# Patient Record
Sex: Male | Born: 1965 | Race: White | Hispanic: No | Marital: Married | Smoking: Heavy tobacco smoker
Health system: Southern US, Community
[De-identification: ages and names within clinical notes are randomized; demographics above are authoritative.]

## PROBLEM LIST (undated history)

## (undated) DIAGNOSIS — Z9889 Other specified postprocedural states: Secondary | ICD-10-CM

## (undated) DIAGNOSIS — I10 Essential (primary) hypertension: Secondary | ICD-10-CM

## (undated) DIAGNOSIS — B1920 Unspecified viral hepatitis C without hepatic coma: Secondary | ICD-10-CM

## (undated) DIAGNOSIS — F1910 Other psychoactive substance abuse, uncomplicated: Secondary | ICD-10-CM

## (undated) DIAGNOSIS — G4730 Sleep apnea, unspecified: Secondary | ICD-10-CM

## (undated) DIAGNOSIS — E782 Mixed hyperlipidemia: Secondary | ICD-10-CM

## (undated) DIAGNOSIS — F419 Anxiety disorder, unspecified: Secondary | ICD-10-CM

## (undated) DIAGNOSIS — R519 Headache, unspecified: Secondary | ICD-10-CM

## (undated) DIAGNOSIS — F32A Depression, unspecified: Secondary | ICD-10-CM

## (undated) DIAGNOSIS — F1990 Other psychoactive substance use, unspecified, uncomplicated: Secondary | ICD-10-CM

## (undated) DIAGNOSIS — F1911 Other psychoactive substance abuse, in remission: Secondary | ICD-10-CM

## (undated) HISTORY — DX: Other psychoactive substance abuse, in remission: F19.11

## (undated) HISTORY — PX: APPENDECTOMY: SHX54

## (undated) HISTORY — PX: BACK SURGERY: SHX140

## (undated) HISTORY — DX: Essential (primary) hypertension: I10

## (undated) HISTORY — DX: Other specified postprocedural states: Z98.890

## (undated) HISTORY — DX: Mixed hyperlipidemia: E78.2

---

## 1997-11-09 ENCOUNTER — Encounter: Admission: RE | Admit: 1997-11-09 | Discharge: 1998-02-07 | Payer: Self-pay | Admitting: Neurosurgery

## 1998-01-24 ENCOUNTER — Encounter: Admission: RE | Admit: 1998-01-24 | Discharge: 1998-04-24 | Payer: Self-pay | Admitting: Anesthesiology

## 1998-05-11 ENCOUNTER — Encounter: Payer: Self-pay | Admitting: Neurosurgery

## 1998-05-11 ENCOUNTER — Ambulatory Visit (HOSPITAL_COMMUNITY): Admission: AD | Admit: 1998-05-11 | Discharge: 1998-05-11 | Payer: Self-pay | Admitting: Neurosurgery

## 1998-05-24 ENCOUNTER — Encounter: Payer: Self-pay | Admitting: Neurosurgery

## 1998-05-24 ENCOUNTER — Inpatient Hospital Stay (HOSPITAL_COMMUNITY): Admission: RE | Admit: 1998-05-24 | Discharge: 1998-05-26 | Payer: Self-pay | Admitting: Neurosurgery

## 1998-06-25 ENCOUNTER — Ambulatory Visit (HOSPITAL_COMMUNITY): Admission: RE | Admit: 1998-06-25 | Discharge: 1998-06-25 | Payer: Self-pay | Admitting: Neurosurgery

## 1998-06-25 ENCOUNTER — Encounter: Payer: Self-pay | Admitting: Neurosurgery

## 1998-08-16 ENCOUNTER — Encounter: Payer: Self-pay | Admitting: Neurosurgery

## 1998-08-16 ENCOUNTER — Ambulatory Visit (HOSPITAL_COMMUNITY): Admission: RE | Admit: 1998-08-16 | Discharge: 1998-08-16 | Payer: Self-pay | Admitting: Neurosurgery

## 1999-02-28 ENCOUNTER — Encounter: Admission: RE | Admit: 1999-02-28 | Discharge: 1999-05-23 | Payer: Self-pay | Admitting: Anesthesiology

## 1999-05-23 ENCOUNTER — Encounter: Admission: RE | Admit: 1999-05-23 | Discharge: 1999-08-21 | Payer: Self-pay | Admitting: Anesthesiology

## 1999-08-21 ENCOUNTER — Encounter: Admission: RE | Admit: 1999-08-21 | Discharge: 1999-11-13 | Payer: Self-pay | Admitting: Anesthesiology

## 1999-11-13 ENCOUNTER — Encounter: Admission: RE | Admit: 1999-11-13 | Discharge: 2000-02-11 | Payer: Self-pay | Admitting: Anesthesiology

## 2000-02-20 ENCOUNTER — Encounter: Admission: RE | Admit: 2000-02-20 | Discharge: 2000-05-20 | Payer: Self-pay | Admitting: Anesthesiology

## 2000-06-18 ENCOUNTER — Encounter: Admission: RE | Admit: 2000-06-18 | Discharge: 2000-09-16 | Payer: Self-pay | Admitting: Anesthesiology

## 2000-09-09 ENCOUNTER — Encounter: Payer: Self-pay | Admitting: Anesthesiology

## 2000-09-09 ENCOUNTER — Ambulatory Visit (HOSPITAL_COMMUNITY): Admission: RE | Admit: 2000-09-09 | Discharge: 2000-09-09 | Payer: Self-pay | Admitting: Anesthesiology

## 2000-09-17 ENCOUNTER — Encounter: Admission: RE | Admit: 2000-09-17 | Discharge: 2000-12-16 | Payer: Self-pay | Admitting: Anesthesiology

## 2001-01-14 ENCOUNTER — Encounter: Admission: RE | Admit: 2001-01-14 | Discharge: 2001-04-10 | Payer: Self-pay | Admitting: Anesthesiology

## 2002-02-25 ENCOUNTER — Emergency Department (HOSPITAL_COMMUNITY): Admission: EM | Admit: 2002-02-25 | Discharge: 2002-02-25 | Payer: Self-pay | Admitting: Internal Medicine

## 2002-02-28 ENCOUNTER — Ambulatory Visit (HOSPITAL_COMMUNITY): Admission: RE | Admit: 2002-02-28 | Discharge: 2002-02-28 | Payer: Self-pay | Admitting: Internal Medicine

## 2006-12-19 ENCOUNTER — Ambulatory Visit (HOSPITAL_COMMUNITY): Admission: RE | Admit: 2006-12-19 | Discharge: 2006-12-19 | Payer: Self-pay | Admitting: Neurosurgery

## 2007-12-20 ENCOUNTER — Ambulatory Visit: Payer: Self-pay | Admitting: Psychiatry

## 2007-12-20 ENCOUNTER — Inpatient Hospital Stay (HOSPITAL_COMMUNITY): Admission: AD | Admit: 2007-12-20 | Discharge: 2007-12-22 | Payer: Self-pay | Admitting: Psychiatry

## 2009-03-15 ENCOUNTER — Emergency Department (HOSPITAL_COMMUNITY): Admission: EM | Admit: 2009-03-15 | Discharge: 2009-03-15 | Payer: Self-pay | Admitting: Emergency Medicine

## 2009-04-23 ENCOUNTER — Ambulatory Visit: Payer: Self-pay | Admitting: Pain Medicine

## 2009-05-21 ENCOUNTER — Ambulatory Visit: Payer: Self-pay | Admitting: Pain Medicine

## 2009-05-29 ENCOUNTER — Ambulatory Visit: Payer: Self-pay | Admitting: Pain Medicine

## 2009-06-18 ENCOUNTER — Ambulatory Visit: Payer: Self-pay | Admitting: Pain Medicine

## 2009-07-16 ENCOUNTER — Ambulatory Visit: Payer: Self-pay | Admitting: Pain Medicine

## 2009-08-15 ENCOUNTER — Ambulatory Visit: Payer: Self-pay | Admitting: Pain Medicine

## 2009-09-12 ENCOUNTER — Ambulatory Visit: Payer: Self-pay | Admitting: Pain Medicine

## 2009-10-03 ENCOUNTER — Ambulatory Visit: Payer: Self-pay | Admitting: Pain Medicine

## 2009-12-04 ENCOUNTER — Ambulatory Visit: Admission: RE | Admit: 2009-12-04 | Discharge: 2009-12-07 | Payer: Self-pay | Admitting: Anesthesiology

## 2009-12-12 ENCOUNTER — Encounter (HOSPITAL_COMMUNITY): Admission: RE | Admit: 2009-12-12 | Discharge: 2010-01-11 | Payer: Self-pay | Admitting: Anesthesiology

## 2010-12-24 NOTE — H&P (Signed)
Jerome Camacho, Jerome Camacho             ACCOUNT NO.:  [id number]   MEDICAL RECORD NO.:  [id number]          PATIENT TYPE:  IPS   LOCATION:  0500                          FACILITY:  [facility]   PHYSICIAN:  Geoffery Lyons, M.D.      DATE OF BIRTH:  1966/03/02   DATE OF ADMISSION:  12/20/2007  DATE OF DISCHARGE:                       PSYCHIATRIC ADMISSION ASSESSMENT   IDENTIFYING INFORMATION:  A 45 year old white male, married.  This is a  voluntary admission.   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  He  presented to our hospital as a walk-in patient complaining of some  suicidal thoughts.  Said that he had not gotten as far as developing any  kind of plan.  Says that he is about to lose his business and his home  and his mother's house that he is also responsible for.  He cites  problems with the economic down-turn.  His business is not what it  should be.  Also financial stressors because his chronic back pain  prevents him from seeking other employment.  Struggling with back pain  with poor relief.  He is followed by Dr. Thyra Breed.  He had been  putting off getting help for his depression.  Feels that his pain is a  major factor in his depression.  Therefore he says he flushed his last  week of pain medicines down the toilet in order to motivate himself to  come for treatment.  Denies any homicidal thought.  No active suicidal  thoughts today although he endorses depressed mood with anhedonia,  decreased energy, sadness, a lot of worry about his financial situation  disrupting his sleep.  No panic attacks.  He denies substance abuse or  homicidal thoughts.   PAST PSYCHIATRIC HISTORY:  First [facility] admission.  He  does have a history of being treated in the past by Milagros Evener, M.D.,  and has been on Lexapro, Serzone and other SSRIs.  He has felt that  nothing has helped him.   He took Lexapro in the recent past but not in  the last few weeks because he was unable to afford it. No known history  of prior suicide attempts.  Reports that Prozac had given him weird  thoughts.  Endorses a history of polysubstance abuse in his late teens  including everything I could get my hands on, but denies substance  abuse since age 58, has endorsed some recent abuse of his opiates and is  on a current pain contract with Dr. Thyra Breed, his pain management  physician.   SOCIAL HISTORY:  Married white male, owns his own business, has a  daughter and a granddaughter that he is close to.  No current legal  problems.  Denies alcohol use.   FAMILY HISTORY:  He denies a history of mental illness or substance  abuse.   MEDICAL HISTORY:  Followed by Dr. Thyra Breed at [facility].  Current medical problems are chronic back pain.  He has  also been seen by Dr.  Roy for surgery, but does not have insurance at  this time to cover it.   CURRENT MEDICATIONS:  1. Oxycodone 30 mg one every 4 hours as needed for breakthrough pain,      last dispensed #90 on Dec 10, 2007.  2. Valium 5 mg 1 tablet by mouth every 6 hours, last dispensed 120      tablets on November 20, 2007.   DRUG ALLERGIES:  None.  He says that Prozac gave him weird thoughts in  the past.   PHYSICAL EXAMINATION:  Physical exam is noted in the record.  Normal  motor exam.  This is a 5 feet 10 inch tall gentleman, 279 pounds,  temperature 98, pulse 69, respirations 20, blood pressure 119/77.   PAST MEDICAL HISTORY:  Remarkable for  1. Appendectomy at age 42.  2. One episode of back surgery in 1999.   DIAGNOSTIC STUDIES:  Urine drug screen is currently pending.  CBC and  chemistry within normal limits.  Liver enzymes normal.  Routine  urinalysis within normal limits.   MENTAL STATUS EXAM:  Fully alert gentleman in no acute distress, blunted  affect, complaining of back pain, having backache from  sleeping in our  beds last night.  Fully alert, coherent, in full contact with reality.  Speech is normal.  Mood is depressed.  Clearly concerned about his  financial stressors, stands to lose a couple of homes to foreclosure,  business is poor and he is unable to keep up with their financial needs  at home.  Having some depressed thoughts, thinking about suicide but no  specific plan.  Cognition is completely intact.  Insight is adequate.  Impulse control and judgment within normal limits.  Concentration and  calculation intact.  Thinking is goal oriented and linear, no evidence  of psychosis.  No delusional statements made.  No flight of ideas or  paranoia.   AXIS I:  Depressive disorder, not otherwise specified.  AXIS II:  Deferred.  AXIS III:  Chronic back pain.  AXIS IV:  Severe financial and employment issues.  AXIS V:  Current 50, past year 69, estimated.   PLAN:  Voluntarily admit the patient with a goal of alleviating his  suicidal thought.  Will go with Celexa, thinking that this might be a  cheaper alternative for him since it is available at $4 for a month's  supply.  He is agreeable to have a family session with his wife.  He is  scheduled for follow-up with his pain physician on Monday and he is  currently enrolled in our depression group.   ESTIMATED LENGTH OF STAY:  2-3 days.      Margaret A. Scott, N.P.      Geoffery Lyons, M.D.  Electronically Signed    MAS/MEDQ  D:  12/21/2007  T:  12/21/2007  Job:  [id number]

## 2010-12-24 NOTE — H&P (Signed)
Jerome Camacho, Jerome Camacho             ACCOUNT NO.:  [id number]   MEDICAL RECORD NO.:  [id number]          PATIENT TYPE:  IPS   LOCATION:  0500                          FACILITY:  [facility]   PHYSICIAN:  Geoffery Lyons, M.D.      DATE OF BIRTH:  [birth year]   DATE OF ADMISSION:  12/20/2007  DATE OF DISCHARGE:                       PSYCHIATRIC ADMISSION ASSESSMENT   TIME OF ASSESSMENT:  9:25 a.m.   IDENTIFYING INFORMATION:  A 45 year old white male.  This is a voluntary  admission.  He is married.   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  He presented as a walk-in to  our hospital complaining of some depression which had gotten worse over  the course of the past 3-4 weeks.  He cites a lot of financial stressors  with economic downturn in his business.  He also reports he has a  history of chronic back pain and this prevents him from working full-  time or pursuing other employment.  The combination of these 2 factors  have created quite a bit of stress in his marriage and made him quite  depressed.  He was previously taking Lexapro, but could not afford it.  Denies that he has gone as far as making any plan for suicide, but  endorses depressed mood.  No homicidal thoughts.   PAST PSYCHIATRIC HISTORY:  First [facility] admission.  He endorses a history  of depression in the past and at one point saw Dr. Milagros Evener and was  treated with Serzone.  He has also had additional SSRIs in the past  which he felt did not help him and had some headache.  Difficulty  affording medication and unable to afford his current Lexapro.  No  history of prior suicide attempts.  He does endorse a past history of  opiate abuse.  Endorses that in his use, he abused anything I could get  my hands on, but has not abused drugs since age 73.   SOCIAL HISTORY:  Married white male.  Has a daughter and granddaughter  that he is close to.  Endorses some  marital stressors due to financial  circumstances as noted.  Owns his own business.  No legal problems.   MEDICAL HISTORY:  The patient is followed by Dr. Thyra Breed at the  [facility].   CURRENT MEDICATIONS:  1. Diazepam 5 mg 1 q.6 h., number 120 tablets, last filled on November 20, 2007.  2. Oxycodone/hydrochloride 30 mg tablets, dispensed number 90 on Dec 10, 2007, to take 1 tablet every 4 hours as needed for breakthrough      pain.   Dictation ends at this point.      Margaret A. Scott, N.P.      Geoffery Lyons, M.D.  Electronically Signed    MAS/MEDQ  D:  12/21/2007  T:  12/21/2007  Job:  [id number]

## 2010-12-27 NOTE — Consult Note (Signed)
[facility]  Patient:    Jerome Camacho, Jerome Camacho                 MRN: [id number] Proc. Date: 12/26/99 Attending:  Thyra Breed, M.D. CC:         Eliezer Bottom, M.D.             Kari Baars, M.D.             [name], M.D.                          Consultation Report  HISTORY:  The patient dropped in today after seeing Dr. Evelene Croon.  He has apparently run out of Tylox.  He injured his ankle over the weekend and has had to take increased doses of this.  He has not sought any medical attention for this.  He is also working through some issues of some events that occurred with his father and his other family members.  PHYSICAL EXAMINATION:  VITAL SIGNS:  Blood pressure 106/61.  Heart rate 75.  Respiratory rate 16.  O2 saturations 97%.  Pain level 8/10.  Temperature 97.1 degrees.  EXTREMITIES:  The patient demonstrates pain over the lateral malleolus, which is accentuated by inversion of his foot.  He has a contusion over the lateral aspect of the left ankle.  IMPRESSION: 1. Left ankle pain which I suspect is either a tear of ligaments/tendons or    potential underlying fracture of the lateral malleolus. 2. Low back pain on the basis of degenerative disk disease. 3. Myofascial pain. 4. Depression per Dr. Evelene Croon. 5. Other medical problems per Dr. Kari Baars.  DISPOSITION: 1. Discontinue Tylox.  Continue on Percocet, 1-2 p.o. q.6h. p.r.n. pain in the eft    ankle, #100 with no refill. 2. Continue on methadone as previously. 3. The patient will see one of the orthopedic surgeons at [facility] at    3:00 p.m. today.  He is in agreement with follow-up there.  We were trying to    avoid having him go through the emergency room. DD:  12/26/99 TD:  12/31/99 Job: [id number]

## 2010-12-27 NOTE — H&P (Signed)
[facility]  Patient:    Jerome Camacho, Jerome Camacho                 MRN: [id number] Adm. Date:  [id number] Attending:  Thyra Breed                         History and Physical  CHIEF COMPLAINT:  Jerome Camacho actually looks better today than he has looked in a long  time.  As I explained to him, I attribute a lot of this improvement to Dr. Eliezer Bottom and her treatment of his depression.  He is much less depressed than he has been in the past.  HISTORY OF PRESENT ILLNESS:  He did have a heat stroke when he went to an outdoor concert, and was treated in the an emergency room for this.  He rates his pain t 5/10.  He continues to have pain radiating out to the left lower extremity, and he has some left shoulder pain, which is mostly myofascial in origin.  He is losing weight.  He is asking about exercising and going back to work on light duty.  CURRENT MEDICATIONS: 1. Serzone 200 mg q.d. 2. Methadone 5 mg t.i.d. 3. Xanax very rarely.  PHYSICAL EXAMINATION:  VITAL SIGNS:  Blood pressure 125/62, heart rate 61, respirations 16, O2 saturation 95%.  Pain level 5/10.  Temperature 97.3 degrees.  NEUROLOGIC:  Straight leg raising signs are negative.  Deep tendon reflexes 0-1+ at the knees, and 2+ at the ankles and symmetric.  Motor is 5/5.  He has some tenderness in his left shoulder to palpation in the trapezius muscle.  ADDITIONAL HISTORY:  The patient notes that his left ankle is not bothering him  superiorly.  IMPRESSION: 1. Low back pain, status post surgical intervention for degenerative disk    disease. 2. Depression, per Dr. Evelene Croon.  DISPOSITION: 1. Continue on methadone 5 mg one p.o. q.8h. #90, with no refills. 2. Tylox 5/500 one p.o. q.8h. p.r.n. breakthrough pain #50, with no refills. 3. Remain off Zanaflex which he was intolerant of. 4. The patient was encouraged to follow up with Dr. Evelene Croon. 5. Follow up with me in four weeks. 6. The  patient was advised to go ahead and return to light duty at work,    but no lifting. 7. He was encouraged to go ahead and walk at least one mile per day. 8. He is asking about getting established with a personal trainer who has    worked with people with low back pain in the past, and I encouraged him    to continue with communication with this gentleman, but that I would like    to see him recondition himself with walking initially, before proceeding    to any more vigorous rehabilitation.  I do feel that he will ultimately    benefit from this. DD:  02/20/00 TD:  02/20/00 Job: [id number]

## 2010-12-27 NOTE — Procedures (Signed)
[facility]  Patient:    Jerome Camacho, Jerome Camacho                 MRN: [id number] Proc. Date: 09/11/00 Adm. Date:  [id number] Attending:  Thyra Breed                           Procedure Report  PROCEDURE:  Lumbar epidural steroid injection.  DIAGNOSIS:  Lumbar radiculopathy with lumbar degenerative disk disease at the L4-5 with a disk protrusion pressing on the left L5 nerve root.  ANESTHESIOLOGIST:  Thyra Breed, M.D.  INTERVAL HISTORY:  The patient has noted some lessening of his left leg discomfort, but he continues to have this episodically.  It is brought on by prolonged sitting or over activity.  He continues to have some left groin discomfort.  I reviewed his MRI from January 30 which does show an annular disk bulge at 4-5 with shallow protrusion over to the left L5 nerve root.  I advised him that we needed to consider epidural steroid injections since he has not responded to conservative management with muscle relaxants and oral corticosteroids.  We reviewed the potential risks and benefits of this.  MEDICATIONS:  Unchanged.  PHYSICAL EXAMINATION:  VITAL SIGNS:  The patient is afebrile with vital signs stable.  NEUROLOGIC:  Grossly unchanged from his last visit.  DESCRIPTION OF PROCEDURE:  After informed consent was obtained, the patient was placed in the sitting position and monitored.  His back was prepped with Betadine x 3.  A skin wheal was raised at the L4-5 inner space with 1% lidocaine.  A 20-gauge Tuohy needle was introduced in the lumbar epidural space to loss of resistance to preservative free normal saline.  There was no CSF nor blood.  Medrol 80 mg and 8 ml preservative free normal saline was gently injected.  The needle was flushed with preservative free normal saline and removed intact.  POSTPROCEDURE CONDITION:  Stable.  DISCHARGE INSTRUCTIONS: 1. Resume previous diet. 2. Limitation of activities per instruction  sheet. 3. Continue on current medications. 4. Follow up with me in one week for repeat injection. DD:  09/11/00 TD:  09/12/00 Job: [id number]

## 2010-12-27 NOTE — Discharge Summary (Signed)
Jerome Camacho, Jerome Camacho             ACCOUNT NO.:  [id number]   MEDICAL RECORD NO.:  [id number]          PATIENT TYPE:  IPS   LOCATION:  0500                          FACILITY:  [facility]   PHYSICIAN:  Geoffery Lyons, M.D.      DATE OF BIRTH:  09-04-1965   DATE OF ADMISSION:  12/20/2007  DATE OF DISCHARGE:  12/22/2007                               DISCHARGE SUMMARY   CHIEF COMPLAINT AND PRESENT ILLNESS:  This was the first admission to  [facility] for this 45 year old white male, married,  voluntarily admitted.  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  He  presented to the hospital as a walk-in, complaining of suicidal  thoughts, but not gotten as far as developing a kind of plan.  He was  about to lose his business and his home and his mother's house for which  he also responsible, problem with the economic downturn, his business  not what it should be or what it used to be, also financial stressors  because of the chronic back pain preventing him from seeking other  unemployment, struggling with back pain, poor relief, follow by Dr. Thyra Breed and thus had been putting off getting help for his depression.  He endorsed that the pain is a major factor for depression.  He claimed  that he flushed his last week of pain medicine down the toilet in order  to motivate himself to come for treatment.  He endorsed no suicidal  thoughts, endorsed depression, worried over his financial situation.   PAST PSYCHIATRIC HISTORY:  [facility]  He was  treated in the past by Dr. Milagros Evener, had been on Lexapro, Serzone  and other SSRIs; he said nothing help him.  He took Lexapro, but could  not afford it.  No prior suicide attempts.   ALCOHOL AND DRUG HISTORY:  Endorsed a past history of polysubstance  abuse in the late teens including everything I could get my hand on.  He did endorse some recent abuse of all his opiates and is on current  pain contact with Dr.  Vear Clock.   MEDICAL HISTORY:  Chronic back pain.   MEDICATION:  1. Oxycodone 30 mg every 4 hours as needed, last dispensed May 1,      ninety.  2. Valium 5 mg one every 6 hours.   PHYSICAL EXAMINATION:  Exam failed to show any acute findings.   LABORATORY WORK:  CBC within normal limits.  Liver enzyme within normal  limits.  UDS was negative for opiates.   MENTAL STATUS EXAMINATION:  Exam reveals a fully alert, cooperative male  complaining of back pain, having backache from sleeping in our bed,  alert, coherent, full contact with reality, mood depressed, concerned  over his financial stress, no suicidal ideations, wanting some help,  wanting to have his pain pills.   ADMITTING DIAGNOSES:   AXES I:  1. Depressive disorder, not otherwise specified.  2. Opiate dependence.   AXIS II:  No diagnosis.   AXIS III:  Chronic back pain.   AXIS IV:  Moderate.  AXIS V:  Upon admission 45, highest global assessment of functioning in  the last year is 65.   COURSE IN THE HOSPITAL:  He endorsed depression, lots of financial  stress, degenerative disk disease and back pain, disability denied, able  to share the medication he had taken before, saw Dr. Evelene Croon, but has some  complaints about the treatment he got from her.  He came with the story  that he flushed his pain pills and his benzodiazepines as a way to get  help, then later he admitted that he was abusing it and that he had a  contact with Dr. Vear Clock that if it were to happen again, he was going  to be discharged from the clinic.  He did not want [place] to contact Dr.  Vear Clock.  He felt that there was not much for [place] to offer.  We offered  that we could detox him from the opiates, but on May 13, he felt that he  could be home.  He denied any suicidal or homicidal ideations; in fact,  the UDS was negative, so there is no clear idea when he took his last  opiate and what happened with the prescription, if he abused it or if he   diverted; none of this was able to be clarified.  He was not suicidal.  He was going to discuss the issues with Dr. Vear Clock; he did not allow  [place] to contact him.   DISCHARGE DIAGNOSES:   AXES I:  1. Depressive disorder, not otherwise specified.  2. Adjustment disorder with mixed emotional features.  3. Opiate dependence, benzodiazepine, rule out benzodiazepine abuse.   AXIS II:  No diagnosis.   AXIS III:  Back and neck pain.   AXIS IV:  Moderate.   AXIS V:  Upon discharge 50.   DISCHARGE MEDICATIONS:  Discharged on Celexa 20 mg per day.   FOLLOWUP:  Follow with Dr. Vear Clock, where he is going to discuss his  pain management, as well as [facility].      Geoffery Lyons, M.D.  Electronically Signed     IL/MEDQ  D:  01/20/2008  T:  01/20/2008  Job:  [id number]

## 2010-12-27 NOTE — Procedures (Signed)
[facility]  Patient:    Jerome Camacho, Jerome Camacho                 MRN: [id number] Proc. Date: 09/25/00 Adm. Date:  [id number] Attending:  Thyra Breed CC:         [name], M.D.  Edwin Cap, M.D.   Procedure Report  PROCEDURE:  Lumbar epidural steroid injection.  DIAGNOSIS:  Lumbar degenerative disk disease with disk protrusion to the left over L5, with underlying radiculopathy.  INTERVAL HISTORY:  The patient has noted marked improvement.  He is now only taking his Methadone for pain control.  EXAMINATION:  VITAL SIGNS:  Blood pressure 120/62, heart rate 96, respiratory rate 20, O2 saturations 96%, pain level 0 out of 10, temperature 97.3.  BACK:  Shows good healing from previous injection site.  ADDITIONAL HISTORY:  The patient has recently had a viral infection; he states that he has been queasy, but feels alright today.  DESCRIPTION OF PROCEDURE:  After an informed consent was obtained, the patient was placed in the sitting position and monitored.  His back was prepped with Betadine x 3.  A skin wheal was raised at the L4-5 interspace with 1% lidocaine.  A 20-gauge Tuohy needle was introduced into the lumbar epidural space to loss of resistance to preservative-free normal saline.  There was no cerebral spinal fluid nor blood.  Then 80 mg of Medrol and 8 mL of preservative-free normal saline was gently injected.  The needle was flushed with preservative-free normal saline and removed intact.  The patient became vagal during the process of injecting, and after the needle was removed he was placed in the supine position and allowed to recover.  After drinking [employer], he felt much improved and his heart rate was coming up.  POSTPROCEDURE CONDITIONS:  Stable.  DISCHARGE INSTRUCTIONS: 1. Resume previous diet. 2. Limitation in activities per instruction sheet. 3. Continue with current medications. 4. Follow up with me in four weeks.  I gave  him prescription today for    Methadone 5 mg one p.o. q.8h. #90 with no refill. DD:  09/25/00 TD:  09/25/00 Job: [id number]

## 2010-12-27 NOTE — Consult Note (Signed)
[facility]  Patient:    Jerome Camacho, Jerome Camacho                 MRN: [id number] Proc. Date: 04/23/00 Adm. Date:  [id number] Attending:  Thyra Breed CC:         Kari Baars, M.D.  [name], M.D.  Eliezer Bottom, M.D.   Consultation Report  FOLLOWUP EVALUATION:  The patient comes in for followup evaluation of his chronic low back pain on the basis of degenerative disk disease.  Since his last visit, he has been relatively stable with regard to his back discomfort. He continues to have recurrent left shoulder pain which comes in spasms.  He continues to get along with his current medical regimen of Xanax, Serzone, methadone and Tylox.  EXAMINATION  VITAL SIGNS:  Blood pressure 119/71, heart rate 72, respiratory rate 16, O2 saturation 98% and pain level is 5/10.  MUSCULOSKELETAL:  Range of motion of his joints in the upper extremities was intact.  NEUROLOGIC:  Straight leg raise signs are negative.  Deep tendon reflexes are symmetric in the lower extremities.  IMPRESSION 1. Low back pain on the basis of degenerative disk disease, status post    surgical intervention. 2. Shoulder pain, which I suspect is predominantly myofascial, rule out    possible underlying cervical nerve impingement. 3. Other medical problems per primary care physician.  DISPOSITION 1. Continue on methadone 5 mg 1 p.o. q.8h., #90 with no refill. 2. Continue Tylox 5/500 mg 1 p.o. q.8h. p.r.n. breakthrough pain, #50 with no    refill. 3. Continue other medications per Dr. Eliezer Bottom. 4. Follow up with me in eight weeks.  The patient will stop by in    approximately three to four weeks for prescriptions for his opiates. DD:  04/23/00 TD:  04/24/00 Job: [id number]

## 2010-12-27 NOTE — H&P (Signed)
[facility]  Patient:    Jerome Camacho, Jerome Camacho                 MRN: [id number] Adm. Date:  [id number] Attending:  Thyra Breed CC:         Jerome Camacho, M.D.  Kari Baars, M.D.  Eliezer Bottom, M.D.   History and Physical  This is a followup evaluation.  HISTORY OF PRESENT ILLNESS:  Jerome Camacho comes in for followup today.  He is not doing well.  We switched him to Vicodin, off the Tylox, and it has not been helpful, in addition to a prednisone DosePak.  He feels more depressed than he has been.  He has not been back to see Dr. Evelene Croon in some time, and I highly recommended that he go ahead and see her.  He is weepy and feels very blue. His lower back apparently is in spasm after a couple of falls that he has had.  CURRENT MEDICATIONS: 1. Serzone. 2. Xanax. 3. Methadone. 4. Prednisone, which he finished yesterday. 5. Vicodin. 6. Going back on the Tylox.  PHYSICAL EXAMINATION:  VITAL SIGNS:  Blood pressure 124/78, heart rate 80, respiratory rate 18, O2 saturations 98%, pain level is 8/10.  NEUROLOGIC:  The patient exhibited tenderness over the right lower lumbar facet joints, especially excentuated by hyperextension.  Forward flexion reduces the discomfort.  Straight leg raise signs are negative.  Deep tendon reflexes are symmetric in the lower extremities.  IMPRESSION: 1. Low back pain which he attributes to the strain of a fall, which appears    more facet joint syndrome on exam today, superimposed on his underlying    lumbar degenerative disc disease. 2. Depression per Dr. Evelene Croon.  DISPOSITION: 1. Increase methadone to 5 mg one p.o. q.6h. #120 with no refill. 2. Stop Vicodin ES. 3. Tylox 5/500 one p.o. to two p.o. q.6h. p.r.n. #50 with no refill. 4. Flexeril 10 mg one p.o. q.8h. x 5 days #15 with 2 refills. 5. Follow up with me in 2 weeks.  The patient is advised that we will need to consider repeating his lumbar spine MRI if he is not  showing some signs of improvement. DD:  08/28/00 TD:  08/29/00 Job: [id number]

## 2010-12-27 NOTE — Procedures (Signed)
[name]  Patient:    Jerome Camacho, CHARRETTE                 MRN: [id number] Proc. Date: 09/18/00 Adm. Date:  [id number] Attending:  Thyra Breed CC:         Eliezer Bottom, M.D.  [name], M.D.   Procedure Report  PROCEDURE:  Lumbar epidural steroid injection.  DIAGNOSIS:  Lumbar degenerative disk disease with disk protrusion to the left at L5.  ANESTHESIOLOGIST:  Thyra Breed, M.D.  INTERVAL HISTORY:  The patient noted marked improvement after his last injection.  He now has gotten off the Tylox and has weaned down off the Xanax. He continues on methadone 5 mg 3 times a day and Serzone.  He feels much improved overall.  His disposition is remarkably better.  PHYSICAL EXAMINATION:  VITAL SIGNS:  Blood pressure 154/72, heart rate 87, respiratory rate 20, O2 saturation 98%, pain level 1/10.  BACK:  Shows good healing from previous injection site. DESCRIPTION OF PROCEDURE:  After informed consent was obtained, the patient was placed in the sitting position and monitored.  His back was prepped with Betadine x 3.  A skin wheal was raised at the L5-S1 interspace with 1% lidocaine.  A 20-gauge Tuohy needle was introduced in the lumbar epidural space to loss of resistance to preservative free normal saline.  The depth was 6.5 cm.  There was no CSF nor blood.  Medrol 80 mg and 8 ml preservative free normal saline was gently injected.  The needle was flushed with preservative free normal saline and removed intact.  POSTPROCEDURE CONDITION:  Stable.  DISCHARGE INSTRUCTIONS: 1. Resume previous diet. 2. Limitation of activities per instruction sheet. 3. Continue on current medications. 4. Follow up with me in one week for a third injection. DD:  09/18/00 TD:  09/19/00 Job: [id number] IO/[id number]

## 2010-12-27 NOTE — Procedures (Signed)
   Jerome Camacho, Jerome Camacho                         ACCOUNT NO.:  [id number]   MEDICAL RECORD NO.:  [id number]                   PATIENT TYPE:   LOCATION:                                       FACILITY:   PHYSICIAN:  Gerrit Friends. Dietrich Pates, M.D. [facility]        DATE OF BIRTH:   DATE OF PROCEDURE:  03/01/2002  DATE OF DISCHARGE:                                  ECHOCARDIOGRAM   REFERRING PHYSICIANS:  1. Madelin Rear. Sherwood Gambler, M.D.  2. Jesse Sans. Wall, M.D. [facility]   CLINICAL DATA:  The patient is a 45 year old gentleman with palpitations.   1. Continuous electrocardiographic recording was maintained for 24 hours     during which the predominant rhythm was normal sinus with some sinus     bradycardia.  Heart rates in the 40s and 50s were recorded, particularly     in the nocturnal hours.  There was also considerable sinus tachycardia     with peak heart rates in the 140s and 150s and mean heart rates for some     hours greater than 100.  2. No ventricular ectopy recorded.  3. Appreciable supraventricular ectopy was identified by computer analysis,     occurring at the rate of 35 per hour.  At least some of this was     artifactual on analysis of the raw EKG tracings.  4. The patient reported multiple episodes of chest discomfort.  The rhythm     during each was normal sinus or sinus tachycardia.  No significant ST     segment abnormalities identified.   IMPRESSION:  Unremarkable continuous electrocardiographic recording.                                               Gerrit Friends. Dietrich Pates, M.D. [facility]    RMR/MEDQ  D:  03/04/2002  T:  03/14/2002  Job:  [phone]

## 2010-12-27 NOTE — H&P (Signed)
[facility]  Patient:    Jerome Camacho, Jerome Camacho                 MRN: [id number] Adm. Date:  [id number] Attending:  Thyra Breed CC:         Kari Baars, M.D.  [name], M.D.   History and Physical  FOLLOW-UP EVALUATION:  [name] comes in for follow-up evaluation.  He really has minimal change in his symptomatology from his last visit and rates his pain at 5 out of 10.  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  He continues on the serzone, methadone, Xanax, and rarely Tylox.  PHYSICAL EXAMINATION:  VITAL SIGNS:  Blood pressure 126/64, heart rate 65, respiratory rate 12, O2 saturation 98%, pain level is 5 out of 10, and temperature is 98.1.  NEUROLOGIC/MUSCULOSKELETAL:  Deep tendon reflexes were symmetric.  Straight leg signs are negative.  He has minimal tenderness over his lower back.  IMPRESSION: 1. Low back pain on the basis of degenerative disk disease, status post    surgical intervention. 2. Other medical problems per primary care physician.  DISPOSITION: 1. Continue on current medications, including his methadone 5 mg one p.o.    q.8h., #90 with no refill. 2. Tylox 5/500 mg one p.o. q.8h. p.r.n. breakthrough pain, #50 with no refill. 3. Continue on other medications. 4. Follow up with me in four to eight weeks. DD:  03/17/00 TD:  03/19/00 Job: [id number]
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
## 2010-12-27 NOTE — Procedures (Signed)
[place]  Patient:    Jerome Camacho, Jerome Camacho                 MRN: [id number] Proc. Date: 02/20/00 Adm. Date:  [id number] Attending:  Thyra Breed                           Procedure Report  ADDENDUM:  The patient was encouraged to consider meeting with a personal trainer but for the time being, I advised him I would just continue with walking. DD:  02/20/00 TD:  02/20/00 Job: [id number]

## 2010-12-27 NOTE — Consult Note (Signed)
[facility]  Patient:    Jerome, Camacho                 MRN: [id number] Proc. Date: 01/07/00 Attending:  Thyra Breed, M.D. CC:         Kari Baars, M.D.             Eliezer Bottom, M.D.             [name], M.D.                          Consultation Report  FOLLOWUP EVALUATION:  Jerome Camacho comes in for followup evaluation of his chronic low back pain on the basis of degenerative disc disease and myofascial pain.  He has seen Dr. Ollen Gross since his last visit for his ankle sprain and has been put in an orthotic for this.  His biggest complaint today is that he feels somewhat somnolent from the [facility] but he is scheduled to see Dr. Eliezer Bottom next week.  He continues to have lower back discomfort but his left ankle is overshadowing this.  EXAMINATION:  VITAL SIGNS:  Vital signs are stable.  Please see flow sheet for details.  NEUROLOGIC:  He demonstrates deep tendon reflexes to be symmetric at the knees and ankles.  Motor was 5/5.  EXTREMITIES:  He exhibits tenderness of his left ankle on range of motion.  IMPRESSION: 1. Low back pain on the basis of degenerative disc disease. 2. Myofascial pain predominantly of the left shoulder, improved. 3. Depression, per Dr. Evelene Croon. 4. Headaches, per Dr. Kari Baars.  DISPOSITION: 1. Continue on methadone 5 mg one p.o. q.8h., #90 with no refill. 2. Follow up with me in four weeks. 3. Continue with Lidoderm to his shoulder area as needed. 4. I went ahead and filled out his form for [place]. 5. Follow up with me in four weeks. 6. I did not advocate using the Fioricet any longer for his headaches.  He has    noted they are much less prominent since he has been on the Serzone. DD:  01/07/00 TD:  01/07/00 Job: [id number]

## 2010-12-27 NOTE — Consult Note (Signed)
[facility]  Patient:    RILAN, EILAND                 MRN: [id number] Proc. Date: 06/18/00 Adm. Date:  [id number] Disc. Date: [id number] Attending:  Thyra Breed CC:         Kari Baars, M.D.  [name], M.D.  Eliezer Bottom, M.D.   Consultation Report  FOLLOWUP EVALUATION:  [name] comes in today doing fairly well.  He rates his pain at 2/10.  He really is noting less problems with his shoulders, back and headaches.  He continues on the methadone 5 mg three times a day and the Tylox p.r.n., sparingly.  EXAMINATION  VITAL SIGNS:  Blood pressure is 109/46.  Heart rate is 100.  Respiratory rate is 17.  O2 saturation is 97%.  Pain level is 2/10.  NEUROLOGIC:  Straight leg raise signs are negative and deep tendon reflexes are symmetric.  IMPRESSION 1. Low back pain on the basis of lumbar degenerative disk disease. 2. Shoulder pain, quiescent. 3. Headaches, quiescent. 4. Other medical problems per primary care physician.  DISPOSITION 1. Continue on the methadone 5 mg 1 p.o. q.8h., #90 with no refill. 2. Continue Tylox 5/500 1 p.o. q.8h. p.r.n. breakthrough pain, #50 with no    refill. 3. Other medications per Dr. Eliezer Bottom. 4. Follow up with me in eight weeks. DD:  06/18/00 TD:  06/18/00 Job: [id number]

## 2010-12-27 NOTE — Procedures (Signed)
[place]  Patient:    Jerome Camacho, Jerome Camacho                 MRN: [id number] Proc. Date: 12/11/99 Attending:  Thyra Breed, M.D. CC:         Eliezer Bottom, M.D.             Kari Baars, M.D.             [name], M.D.                           Procedure Report  FOLLOW-UP EVALUATION:   [name] comes in for follow-up evaluation of his chronic low-back pain on the basis of degenerative disk disease with give away weakness of his left lower extremity.  He remains out of work, but has gone ahead and seen Dr. Evelene Croon.  He has taken him off of the Effexor and started Serzone.  She has also taken over the prescriptions for the Xanax and given him trazodone to help rest at night.  He rates his pain at 5 out of 10.  He continues with the methadone.  He stated, that he does continue his headaches at times and now has developed a left shoulder pain predominantly in the rhomboids and trapezius muscles.  It is not in his shoulder itself.  He is not seeing a psychologist at this time, but Dr. Evelene Croon is following him very closely.  CURRENT MEDICATIONS:  Serzone, Xanax, methadone 5 mg t.i.d., Fioricet and Tylox p.r.n. and trazodone.  EXAMINATION:  Blood pressure 126/64, heart rate 77, respiratory rate is 16, O2 saturations 98%.  Pain level is 5 out of 10.  Straight leg raise sign is positive on the left side at approximately 60 degrees.  He has increased pain on forward flexion to 40 degrees and on hyperextension.  Deep tendon reflexes were symmetric in the lower extremities.  Motor was 5/5.  His left shoulder demonstrated intact range of motion with a trigger point over the left trapezius.  This area was marked.  PROCEDURE:  After informed consent was obtained verbally, I went ahead and injected his left trapezius trigger point after prepping with Betadine x 3 with 3 cc of 1% lidocaine and 30 mg of Medrol.  IMPRESSION: 1. Low-back pain on the basis of  degenerative disk disease. 2. Myofascial pain of the left shoulder. 3. Depression per Dr. Evelene Croon. 4. Other medical problems per Dr. Shaune Pollack.  DISPOSITION: 1. Continue on methadone 5 mg one p.o. q.8h. #90 with no refill. 2. Follow up with me in four weeks. 3. Injection as per above. 4. I have asked the patient I wished for him to try and stop taking the Tylox    as frequently.  He has Fioricet left over. DD:  12/11/99 TD:  12/12/99 Job: [id number]

## 2010-12-27 NOTE — H&P (Signed)
[facility]  Patient:    KHYLON, DAVIES                 MRN: [id number] Adm. Date:  [id number] Attending:  Thyra Breed CC:         [name], M.D.   History and Physical  HISTORY OF PRESENT ILLNESS:  The patient comes for follow up evaluation of his chronic low back pain.  Since his last evaluation, he has had twinges of pain, but overall has done well, and he rates his pain at 1 out of 10.  He has more fullness in his sinuses, and feels as though he is getting some allergic sinusitis.  He is not getting his typical headaches.  He is rarely taking Tylox and has taken none in the past 10 days.  He continues on the Xanax, Serzone, and methadone as previously.  PHYSICAL EXAMINATION:  VITAL SIGNS:  Blood pressure 113/64, heart rate 80, respiratory rate 16, and O2 saturations 99%.  Pain level is 1 out of 10.  GENERAL:  His exam is grossly unchanged from his last visit.  His spirits are good.  IMPRESSION: 1. Chronic low back pain on the basis of lumbar degenerative disk disease -    stable. 2. Allergic rhinitis, exacerbated. 3. Other medical problems per primary care physician.  DISPOSITION: 1. Continue methadone 5 mg one p.o. q.8h., #90 with no refill. 2. He has refills on the Xanax and will not write for this today. 3. Tylox 5/500 one p.o. q.6h. p.r.n., #100 with no refill. 4. Continue with Serzone per Dr. ________. 5. Claritin 10 mg one p.o. q.d., #30 with six refills. 6. Followup with me in eight weeks.  He will let us know when he needs another    prescription for his methadone. DD:  11/20/00 TD:  11/21/00 Job: [id number]

## 2010-12-27 NOTE — H&P (Signed)
[place]  Patient:    Jerome Camacho, Jerome Camacho                 MRN: [id number] Adm. Date:  [id number] Attending:  Thyra Breed CC:         Elfredia Nevins, M.D.  [name], M.D.  Dr. Lafayette Dragon   History and Physical  HISTORY OF PRESENT ILLNESS:  Jerome Camacho comes in for a followup evaluation of his chronic low-back pain on the basis of lumbar degenerative disk disease.  He has done well since his last visit, although he feels a little more stressed. He has developed some hypoglycemia and fatigue at times but he had a glucose tolerance test which was interpreted as being normal.  He has gotten his blood sugars checked and it has been down as low as 51 at times.  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  CURRENT MEDICATIONS: 1. Xanax. 2. Serzone. 3. Methadone. 4. Tylox p.r.n.  He has had more headaches here recently, so he has been    taking the Tylox more regularly.  PHYSICAL EXAMINATION:  VITAL SIGNS:  Blood pressure 106/58, heart rate 73, respiratory rate 16.  O2 saturation 97%.  Fasting blood sugar was 81.  NEUROLOGIC:  Deep tendon reflexes were 2+ at the right knee, 0 at the left knee, 1+ at the ankles.  Straight leg raise signs are negative.  IMPRESSION: 1. Lumbar degenerative disk disease. 2. Fatigue, etiology unclear. 3. Other medical problems per primary care physician.  DISPOSITION: 1. Continue on the methadone and Tylox as previously.  He does not need    prescriptions for this. 2. He is on Serzone per Dr. Lafayette Dragon. 3. Xanax 0.25 mg 1 p.o. q.8h. p.r.n. #100 with two refills. 4. Follow up with me in eight weeks. 5. He was encouraged to eat frequent protein snacks to see if he can keep his    blood sugar under better control. DD:  01/15/01 TD:  01/16/01 Job: [id number]

## 2010-12-27 NOTE — H&P (Signed)
[facility]  Patient:    Jerome Camacho, Jerome Camacho                 MRN: [id number] Adm. Date:  [id number] Attending:  Thyra Breed CC:         [name], M.D.  Eliezer Bottom, M.D.   History and Physical  FOLLOW-UP EVALUATION:  Jerome Camacho comes in for follow-up evaluation. 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 Nevertheless, this is improving. Associated with the lower back discomfort, it was radiating out into his right groin region. This is abating somewhat, but he has had to take more Tylox. He basically was not taking any Tylox, except for his headaches up until recently. He denied any new neurologic symptoms.  CURRENT MEDICATIONS:  Methadone 5 mg q.8h., Xanax 0.25 mg one to two per day, Tylox p.r.n., and Serzone.  PHYSICAL EXAMINATION:  VITAL SIGNS:  Blood pressure is 107/66, heart rate is 95, respiratory rate is 20, O2 saturation is 97%, pain level is 3/10.  NEUROLOGICAL:  Deep tendon reflexes were 2+ at the right knee, 0 at the left knee, 1 to 2+ at the ankles. Straight leg raise signs are negative.  IMPRESSION: 1. Acute back strain syndrome, improving. 2. Lumbar degenerative disk disease, markedly improved. 3. Other medical problems per primary care physician.  DISPOSITION: 1. Continue on methadone 5 mg one p.o. q.8h., #90. 2. Xanax 0.25 mg one p.o. q.8h. p.r.n., #60 with two refills. 3. Tylox 5/325 one p.o. q.6h. p.r.n., #100 with no refill. 4. Continue on Serzone per Eliezer Bottom, M.D. 5. Follow up with me in 4 weeks. DD:  10/23/00 TD:  10/23/00 Job: [id number]

## 2011-08-07 ENCOUNTER — Other Ambulatory Visit (HOSPITAL_COMMUNITY): Payer: Self-pay | Admitting: Pain Medicine

## 2011-08-07 DIAGNOSIS — M549 Dorsalgia, unspecified: Secondary | ICD-10-CM

## 2011-08-11 ENCOUNTER — Ambulatory Visit (HOSPITAL_COMMUNITY)
Admission: RE | Admit: 2011-08-11 | Discharge: 2011-08-11 | Disposition: A | Discharge status: Home / Self Care | Payer: Medicare Other | Source: Ambulatory Visit | Attending: Pain Medicine | Admitting: Pain Medicine

## 2011-08-11 DIAGNOSIS — M5126 Other intervertebral disc displacement, lumbar region: Secondary | ICD-10-CM | POA: Insufficient documentation

## 2011-08-11 DIAGNOSIS — M545 Low back pain, unspecified: Secondary | ICD-10-CM | POA: Insufficient documentation

## 2011-08-11 DIAGNOSIS — M549 Dorsalgia, unspecified: Secondary | ICD-10-CM

## 2011-08-11 DIAGNOSIS — M79609 Pain in unspecified limb: Secondary | ICD-10-CM | POA: Insufficient documentation

## 2011-08-11 MED ORDER — GADOBENATE DIMEGLUMINE 529 MG/ML IV SOLN
20.0000 mL | Freq: Once | INTRAVENOUS | Status: AC | PRN
  Administered 2011-08-11: 20 mL via INTRAVENOUS

## 2015-02-28 ENCOUNTER — Emergency Department (HOSPITAL_COMMUNITY)
Admission: EM | Admit: 2015-02-28 | Discharge: 2015-02-28 | Disposition: A | Discharge status: Other Acute Inpatient Hospital | Payer: Medicare Other | Attending: Emergency Medicine | Admitting: Emergency Medicine

## 2015-02-28 ENCOUNTER — Encounter (HOSPITAL_COMMUNITY): Payer: Self-pay | Admitting: *Deleted

## 2015-02-28 ENCOUNTER — Inpatient Hospital Stay (HOSPITAL_COMMUNITY)
Admission: AD | Admit: 2015-02-28 | Discharge: 2015-03-06 | DRG: 897 | Disposition: A | Discharge status: Home / Self Care | Payer: Medicare Other | Source: Intra-hospital | Attending: Psychiatry | Admitting: Psychiatry

## 2015-02-28 DIAGNOSIS — F1110 Opioid abuse, uncomplicated: Secondary | ICD-10-CM | POA: Insufficient documentation

## 2015-02-28 DIAGNOSIS — F1994 Other psychoactive substance use, unspecified with psychoactive substance-induced mood disorder: Secondary | ICD-10-CM | POA: Diagnosis present

## 2015-02-28 DIAGNOSIS — F1114 Opioid abuse with opioid-induced mood disorder: Principal | ICD-10-CM | POA: Diagnosis present

## 2015-02-28 DIAGNOSIS — Z720 Tobacco use: Secondary | ICD-10-CM | POA: Diagnosis not present

## 2015-02-28 DIAGNOSIS — M549 Dorsalgia, unspecified: Secondary | ICD-10-CM | POA: Diagnosis present

## 2015-02-28 DIAGNOSIS — G8929 Other chronic pain: Secondary | ICD-10-CM | POA: Diagnosis present

## 2015-02-28 DIAGNOSIS — G4700 Insomnia, unspecified: Secondary | ICD-10-CM | POA: Diagnosis present

## 2015-02-28 DIAGNOSIS — R45851 Suicidal ideations: Secondary | ICD-10-CM | POA: Diagnosis present

## 2015-02-28 DIAGNOSIS — F1510 Other stimulant abuse, uncomplicated: Secondary | ICD-10-CM | POA: Diagnosis not present

## 2015-02-28 DIAGNOSIS — F329 Major depressive disorder, single episode, unspecified: Secondary | ICD-10-CM | POA: Diagnosis present

## 2015-02-28 DIAGNOSIS — F1410 Cocaine abuse, uncomplicated: Secondary | ICD-10-CM | POA: Insufficient documentation

## 2015-02-28 DIAGNOSIS — F17210 Nicotine dependence, cigarettes, uncomplicated: Secondary | ICD-10-CM | POA: Diagnosis present

## 2015-02-28 DIAGNOSIS — F419 Anxiety disorder, unspecified: Secondary | ICD-10-CM | POA: Diagnosis present

## 2015-02-28 DIAGNOSIS — F1310 Sedative, hypnotic or anxiolytic abuse, uncomplicated: Secondary | ICD-10-CM | POA: Diagnosis not present

## 2015-02-28 DIAGNOSIS — F1123 Opioid dependence with withdrawal: Secondary | ICD-10-CM | POA: Diagnosis not present

## 2015-02-28 LAB — URINE MICROSCOPIC-ADD ON

## 2015-02-28 LAB — ETHANOL: Alcohol, Ethyl (B): <5 mg/dL (ref <5)

## 2015-02-28 LAB — BASIC METABOLIC PANEL
Anion gap: 9 (ref 5–15)
BUN: 18 mg/dL (ref 6–20)
CO2: 24 mmol/L (ref 22–32)
Calcium: 9.1 mg/dL (ref 8.9–10.3)
Chloride: 105 mmol/L (ref 101–111)
Creatinine, Ser: 1.23 mg/dL (ref 0.61–1.24)
GFR calc Af Amer: >60 mL/min (ref >60)
GFR calc non Af Amer: >60 mL/min (ref >60)
Glucose, Bld: 114 mg/dL — ABNORMAL HIGH (ref 65–99)
Potassium: 3.4 mmol/L — ABNORMAL LOW (ref 3.5–5.1)
Sodium: 138 mmol/L (ref 135–145)

## 2015-02-28 LAB — CBC WITH DIFFERENTIAL/PLATELET
Basophils Absolute: 0.1 10*3/uL (ref 0.0–0.1)
Basophils Relative: 1 % (ref 0–1)
Eosinophils Absolute: 0.3 10*3/uL (ref 0.0–0.7)
Eosinophils Relative: 3 % (ref 0–5)
HCT: 47.5 % (ref 39.0–52.0)
Hemoglobin: 16.5 g/dL (ref 13.0–17.0)
Lymphocytes Relative: 31 % (ref 12–46)
Lymphs Abs: 4 10*3/uL (ref 0.7–4.0)
MCH: 31.3 pg (ref 26.0–34.0)
MCHC: 34.7 g/dL (ref 30.0–36.0)
MCV: 90 fL (ref 78.0–100.0)
Monocytes Absolute: 1.1 10*3/uL — ABNORMAL HIGH (ref 0.1–1.0)
Monocytes Relative: 9 % (ref 3–12)
Neutro Abs: 7.2 10*3/uL (ref 1.7–7.7)
Neutrophils Relative %: 56 % (ref 43–77)
Platelets: 310 10*3/uL (ref 150–400)
RBC: 5.28 MIL/uL (ref 4.22–5.81)
RDW: 14.1 % (ref 11.5–15.5)
WBC: 12.6 10*3/uL — ABNORMAL HIGH (ref 4.0–10.5)

## 2015-02-28 LAB — URINALYSIS, ROUTINE W REFLEX MICROSCOPIC
Glucose, UA: NEGATIVE mg/dL
Ketones, ur: NEGATIVE mg/dL
Leukocytes, UA: NEGATIVE
Nitrite: NEGATIVE
Protein, ur: 30 mg/dL — AB
Specific Gravity, Urine: 1.042 — ABNORMAL HIGH (ref 1.005–1.030)
Urobilinogen, UA: 1 mg/dL (ref 0.0–1.0)
pH: 5.5 (ref 5.0–8.0)

## 2015-02-28 LAB — RAPID URINE DRUG SCREEN, HOSP PERFORMED
Amphetamines: POSITIVE — AB
Barbiturates: NOT DETECTED
Benzodiazepines: POSITIVE — AB
Cocaine: POSITIVE — AB
Opiates: POSITIVE — AB
Tetrahydrocannabinol: NOT DETECTED

## 2015-02-28 MED ORDER — ZOLPIDEM TARTRATE 5 MG PO TABS: 5.0000 mg | ORAL_TABLET | Freq: Every evening | ORAL | Status: DC | PRN

## 2015-02-28 MED ORDER — NICOTINE 21 MG/24HR TD PT24: 21.0000 mg | MEDICATED_PATCH | Freq: Every day | TRANSDERMAL | Status: DC | PRN

## 2015-02-28 MED ORDER — ALUM & MAG HYDROXIDE-SIMETH 200-200-20 MG/5ML PO SUSP: 30.0000 mL | ORAL | Status: DC | PRN

## 2015-02-28 MED ORDER — CIPROFLOXACIN HCL 500 MG PO TABS
500.0000 mg | ORAL_TABLET | Freq: Once | ORAL | Status: AC
  Administered 2015-02-28: 500 mg via ORAL
  Filled 2015-02-28: qty 1

## 2015-02-28 MED ORDER — METHOCARBAMOL 500 MG PO TABS
500.0000 mg | ORAL_TABLET | Freq: Three times a day (TID) | ORAL | Status: AC | PRN
  Administered 2015-03-01 – 2015-03-04 (×5): 500 mg via ORAL
  Filled 2015-02-28 (×5): qty 1

## 2015-02-28 MED ORDER — NICOTINE 21 MG/24HR TD PT24
21.0000 mg | MEDICATED_PATCH | Freq: Every day | TRANSDERMAL | Status: DC
  Administered 2015-03-01 – 2015-03-06 (×6): 21 mg via TRANSDERMAL
  Filled 2015-02-28 (×8): qty 1

## 2015-02-28 MED ORDER — LOPERAMIDE HCL 2 MG PO CAPS: 2.0000 mg | ORAL_CAPSULE | ORAL | Status: DC | PRN

## 2015-02-28 MED ORDER — ONDANSETRON 4 MG PO TBDP: 4.0000 mg | ORAL_TABLET | Freq: Four times a day (QID) | ORAL | Status: DC | PRN

## 2015-02-28 MED ORDER — CLONIDINE HCL 0.1 MG PO TABS
0.1000 mg | ORAL_TABLET | ORAL | Status: AC
  Administered 2015-03-03 – 2015-03-05 (×4): 0.1 mg via ORAL
  Filled 2015-02-28 (×5): qty 1

## 2015-02-28 MED ORDER — METHOCARBAMOL 500 MG PO TABS: 500.0000 mg | ORAL_TABLET | Freq: Three times a day (TID) | ORAL | Status: DC | PRN

## 2015-02-28 MED ORDER — LOPERAMIDE HCL 2 MG PO CAPS
2.0000 mg | ORAL_CAPSULE | ORAL | Status: AC | PRN
  Administered 2015-03-02: 4 mg via ORAL
  Administered 2015-03-02 – 2015-03-03 (×4): 2 mg via ORAL
  Administered 2015-03-04: 4 mg via ORAL
  Filled 2015-02-28 (×2): qty 1
  Filled 2015-02-28 (×3): qty 2
  Filled 2015-02-28: qty 1

## 2015-02-28 MED ORDER — TRAZODONE HCL 50 MG PO TABS
50.0000 mg | ORAL_TABLET | Freq: Every evening | ORAL | Status: DC | PRN
  Administered 2015-02-28: 50 mg via ORAL
  Filled 2015-02-28 (×7): qty 1

## 2015-02-28 MED ORDER — CLONIDINE HCL 0.1 MG PO TABS
0.1000 mg | ORAL_TABLET | Freq: Every day | ORAL | Status: DC
  Administered 2015-03-06: 0.1 mg via ORAL
  Filled 2015-02-28 (×2): qty 1

## 2015-02-28 MED ORDER — CLONIDINE HCL 0.1 MG PO TABS: 0.1000 mg | ORAL_TABLET | Freq: Every day | ORAL | Status: DC

## 2015-02-28 MED ORDER — ACETAMINOPHEN 325 MG PO TABS
650.0000 mg | ORAL_TABLET | Freq: Four times a day (QID) | ORAL | Status: DC | PRN
  Administered 2015-02-28 – 2015-03-06 (×10): 650 mg via ORAL
  Filled 2015-02-28 (×10): qty 2

## 2015-02-28 MED ORDER — GABAPENTIN 300 MG PO CAPS
300.0000 mg | ORAL_CAPSULE | Freq: Three times a day (TID) | ORAL | Status: DC
  Filled 2015-02-28: qty 1

## 2015-02-28 MED ORDER — ONDANSETRON HCL 4 MG PO TABS: 4.0000 mg | ORAL_TABLET | Freq: Three times a day (TID) | ORAL | Status: DC | PRN

## 2015-02-28 MED ORDER — NAPROXEN 500 MG PO TABS: 500.0000 mg | ORAL_TABLET | Freq: Two times a day (BID) | ORAL | Status: DC | PRN

## 2015-02-28 MED ORDER — GABAPENTIN 400 MG PO CAPS: 800.0000 mg | ORAL_CAPSULE | Freq: Every evening | ORAL | Status: DC | PRN

## 2015-02-28 MED ORDER — TRAZODONE HCL 100 MG PO TABS: 100.0000 mg | ORAL_TABLET | Freq: Every day | ORAL | Status: DC

## 2015-02-28 MED ORDER — DICYCLOMINE HCL 20 MG PO TABS: 20.0000 mg | ORAL_TABLET | Freq: Four times a day (QID) | ORAL | Status: DC | PRN

## 2015-02-28 MED ORDER — CLONIDINE HCL 0.1 MG PO TABS
0.1000 mg | ORAL_TABLET | Freq: Four times a day (QID) | ORAL | Status: DC
  Administered 2015-02-28: 0.1 mg via ORAL
  Filled 2015-02-28: qty 1

## 2015-02-28 MED ORDER — DICYCLOMINE HCL 20 MG PO TABS: 20.0000 mg | ORAL_TABLET | Freq: Four times a day (QID) | ORAL | Status: AC | PRN

## 2015-02-28 MED ORDER — CLONIDINE HCL 0.1 MG PO TABS: 0.1000 mg | ORAL_TABLET | ORAL | Status: DC

## 2015-02-28 MED ORDER — CLONIDINE HCL 0.1 MG PO TABS
0.1000 mg | ORAL_TABLET | Freq: Four times a day (QID) | ORAL | Status: AC
  Administered 2015-02-28 – 2015-03-03 (×9): 0.1 mg via ORAL
  Filled 2015-02-28 (×13): qty 1

## 2015-02-28 MED ORDER — HYDROXYZINE HCL 25 MG PO TABS: 25.0000 mg | ORAL_TABLET | Freq: Four times a day (QID) | ORAL | Status: DC | PRN

## 2015-02-28 MED ORDER — NICOTINE 21 MG/24HR TD PT24
21.0000 mg | MEDICATED_PATCH | Freq: Once | TRANSDERMAL | Status: DC
  Administered 2015-02-28: 21 mg via TRANSDERMAL
  Filled 2015-02-28: qty 1

## 2015-02-28 MED ORDER — ONDANSETRON 4 MG PO TBDP: 4.0000 mg | ORAL_TABLET | Freq: Four times a day (QID) | ORAL | Status: AC | PRN

## 2015-02-28 MED ORDER — ACETAMINOPHEN 325 MG PO TABS: 650.0000 mg | ORAL_TABLET | ORAL | Status: DC | PRN

## 2015-02-28 MED ORDER — HYDROXYZINE HCL 50 MG PO TABS
50.0000 mg | ORAL_TABLET | Freq: Four times a day (QID) | ORAL | Status: AC | PRN
  Administered 2015-03-01 – 2015-03-05 (×8): 50 mg via ORAL
  Filled 2015-02-28 (×8): qty 1

## 2015-02-28 MED ORDER — MAGNESIUM HYDROXIDE 400 MG/5ML PO SUSP: 30.0000 mL | Freq: Every day | ORAL | Status: DC | PRN

## 2015-02-28 MED ORDER — HYDROXYZINE HCL 25 MG PO TABS
25.0000 mg | ORAL_TABLET | Freq: Four times a day (QID) | ORAL | Status: DC | PRN
  Administered 2015-02-28: 25 mg via ORAL
  Filled 2015-02-28: qty 1

## 2015-02-28 MED ORDER — POTASSIUM CHLORIDE CRYS ER 20 MEQ PO TBCR
20.0000 meq | EXTENDED_RELEASE_TABLET | Freq: Two times a day (BID) | ORAL | Status: AC
  Administered 2015-02-28 – 2015-03-02 (×4): 20 meq via ORAL
  Filled 2015-02-28 (×6): qty 1
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
## 2015-02-28 NOTE — ED Notes (Signed)
[facility] care. He left in no acute distress. [facility]. Pt left in no acute distress.

## 2015-02-28 NOTE — [facility] Counselor (Signed)
Patient was accepted to [address] Dr. Jama Flavorsobos attending per Thurman CoyerEric Kaplan, RN, [facility]. Patients nurse made aware, Counselor will contact Dr. Effie ShyWentz EDP to inform of [facility]
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
## 2015-02-28 NOTE — ED Notes (Signed)
Bed: WBH41 Expected date:  Expected time:  Means of arrival:  Comments: Hold for triage 4 

## 2015-02-28 NOTE — ED Provider Notes (Signed)
CSN: [id number]     Arrival date & time 02/28/15  1327 History  This chart was scribed for Jerome BaleElliott Glyn Zendejas, MD by Elveria Risingimelie Horne, ED scribe.  This patient was seen in room WTR4/WLPT4 and the patient's care was started at 1:45 PM.   Chief Complaint  Patient presents with  . Suicidal  . Addiction Problem   The history is provided by the patient. No language interpreter was used.   HPI Comments: Jerome Camacho is a 49 y.o. male who presents to the Emergency Department complaining of increased depression and suicidal thoughts, with plan to "stick a gun inside is mouth if he's going to do it." Patient reports his PA at the pain management clinic left in 06/2014; his last visit with the clinic was in 09/2014 and he has been without his prescribed medications since. Patient states that he has been seen at several urgent cares and EDs who have informed him that they don't treat chronic pain. 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 Patient shares that he has been purchasing drugs off the street: oxycodone, percocet, vicodin, xanax, and hydrocodone, and  cocaine to manage his pain. Patient denies alcohol use. Patient accompanied by his wife, recently separated, and friend who is a Engineer, civil (consulting)nurse. Patient reports last using two percocet this morning, he denies cocaine use today.   No past medical history on file. Past Surgical History  Procedure Laterality Date  . Back surgery     No family history on file. History  Substance Use Topics  . Smoking status: Heavy Tobacco Smoker  . Smokeless tobacco: Not on file  . Alcohol Use: No    Review of Systems    Allergies  Review of patient's allergies indicates not on file.  Home Medications   Prior to Admission medications   Not on File   Triage Vitals: BP 130/86 mmHg  Pulse 120  Temp(Src) 97.6 F (36.4 C) (Oral)  Resp 18  SpO2  96% Physical Exam  Constitutional: He is oriented to person, place, and time. He appears well-developed and well-nourished.  HENT:  Head: Normocephalic and atraumatic.  Right Ear: External ear normal.  Left Ear: External ear normal.  Eyes: Conjunctivae and EOM are normal. Pupils are equal, round, and reactive to light.  Neck: Normal range of motion and phonation normal. Neck supple.  Cardiovascular: Normal rate, regular rhythm and normal heart sounds.   Pulmonary/Chest: Effort normal and breath sounds normal. He exhibits no bony tenderness.  Abdominal: Soft. There is no tenderness.  Musculoskeletal: Normal range of motion.  Bilateral lumbar tenderness   Neurological: He is alert and oriented to person, place, and time. No cranial nerve deficit or sensory deficit. He exhibits normal muscle tone. Coordination normal.  Skin: Skin is warm, dry and intact.  Psychiatric: He has a normal mood and affect. His behavior is normal. Judgment and thought content normal.  Nursing note and vitals reviewed.   ED Course  Procedures (including critical care time)  Medications  acetaminophen (TYLENOL) tablet 650 mg (not administered)  zolpidem (AMBIEN) tablet 5 mg (not administered)  nicotine (NICODERM CQ - dosed in mg/24 hours) patch 21 mg (not administered)  ondansetron (ZOFRAN) tablet 4 mg (not administered)  alum & mag hydroxide-simeth (MAALOX/MYLANTA) 200-200-20 MG/5ML suspension 30 mL (not administered)    Patient Vitals for the past 24 hrs:  BP Temp Temp src Pulse Resp SpO2  02/28/15 1332  130/86 mmHg 97.6 F (36.4 C) Oral 120 18 96 %   15:50- IVC, and first opinion paperwork filled out by me.  CRITICAL CARE Performed by: Flint Melter Total critical care time: 35 minutes Critical care time was exclusive of separately billable procedures and treating other patients. Critical care was necessary to treat or prevent imminent or life-threatening deterioration. Critical care was time spent  personally by me on the following activities: development of treatment plan with patient and/or surrogate as well as nursing, discussions with consultants, evaluation of patient's response to treatment, examination of patient, obtaining history from patient or surrogate, ordering and performing treatments and interventions, ordering and review of laboratory studies, ordering and review of radiographic studies, pulse oximetry and re-evaluation of patient's condition.  5:25 PM Reevaluation with update and discussion. After initial assessment and treatment, an updated evaluation reveals Patient is medically cleared for treatment at a psychiatric facility. [facility] by Dr. Jama Flavors. [name]    COORDINATION OF CARE: 1:55 PM- Discussed treatment plan with patient at bedside and patient agreed to plan.   Medications - No data to display  Patient Vitals for the past 24 hrs:  BP Temp Temp src Pulse Resp SpO2  02/28/15 1332 130/86 mmHg 97.6 F (36.4 C) Oral 120 18 96 %    Labs Review Labs Reviewed - No data to display  Imaging Review No results found.   EKG Interpretation None      MDM   Final diagnoses:  Suicidal ideation  Chronic pain    Polysubstance abuse and chronic pain, with suicidal ideation. Possible UTI, culture has been ordered.Patient is not symptomatic for UTI, so will not treat at this time.  Nursing Notes Reviewed/ Care Coordinated, and agree without changes. Applicable Imaging Reviewed.  Interpretation of Laboratory Data incorporated into ED treatment  Plan: Transfer to a psychiatric hospital.  I personally performed the services described in this documentation, which was scribed in my presence. The recorded information has been reviewed and is accurate.     Jerome Bale, MD 02/28/15 [phone]
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
## 2015-02-28 NOTE — ED Notes (Addendum)
Pt states he has had suicidal thoughts since 2006 which he states has gotten worse over the past several months. Pt states he lost his pain management doctor in April and began getting drugs illegally. Pt states he began using cocaine 1 week ago. Pt states his plan would use one of his guns to kill himself. Pt's wife reports she found the pt with his gun out in June stating "I'm ready to get it over with". Pt states he has been on antidepressants in the past but is not currently taking any.

## 2015-02-28 NOTE — ED Notes (Addendum)
Patient reports  long term opoid use that began in 1999 following back surgery.Reports oxycodone,fentynyl,and tylox use. Pain clinic has discharged patient from their services.  Since then, he has become suicidal with no plan and increasingly anxious. Explained detox protocol and plan of care.  Support offered.

## 2015-02-28 NOTE — ED Notes (Signed)
[facility]. Patient denies HI and AVH at this time. Plan of care discussed and patient voices no complaints at this time. Encouragement and support provided and safety maintain. Q 15 min safety checks remain in place.

## 2015-02-28 NOTE — ED Notes (Signed)
[facility] contacted for transport.

## 2015-02-28 NOTE — ED Notes (Signed)
Provider called with UA result.  [facility]

## 2015-02-28 NOTE — BH Assessment (Signed)
Counselor consulted with Jerome MeansJamison Lord, DNP who recommends inpatient at this time.  Communicated with Dr. Effie ShyWentz who agrees with the disposition, counselor asked EDP about IVC paperwork if it had been initiated. Jerome MeansJamison Lord, DNP states that the patient should be initiated due to patient access to weapons and giving vague about the answers about number of guns and placement and wife confirming that she has previously removed the gun from the patient.

## 2015-02-28 NOTE — Tx Team (Signed)
Initial Interdisciplinary Treatment Plan   PATIENT STRESSORS: Financial difficulties Health problems Substance abuse   PATIENT STRENGTHS: Active sense of humor Average or above average intelligence Communication skills   PROBLEM LIST: Problem List/Patient Goals Date to be addressed Date deferred Reason deferred Estimated date of resolution  "get rid of the depression" 02/28/15     "get rid of the addiction"  02/28/15                                                DISCHARGE CRITERIA:  Improved stabilization in mood, thinking, and/or behavior Withdrawal symptoms are absent or subacute and managed without 24-hour nursing intervention  PRELIMINARY DISCHARGE PLAN: Attend 12-step recovery group Outpatient therapy  PATIENT/FAMIILY INVOLVEMENT: This treatment plan has been presented to and reviewed with the patient, Illa LevelJeffery T Stockard.  The patient and family have been given the opportunity to ask questions and make suggestions.  Arrie AranChurch, Aisling Emigh J 02/28/2015, 10:10 PM

## 2015-03-01 ENCOUNTER — Other Ambulatory Visit: Payer: Self-pay

## 2015-03-01 DIAGNOSIS — F1994 Other psychoactive substance use, unspecified with psychoactive substance-induced mood disorder: Secondary | ICD-10-CM

## 2015-03-01 LAB — HEPATIC FUNCTION PANEL
ALT: 18 U/L (ref 17–63)
AST: 17 U/L (ref 15–41)
Albumin: 3.9 g/dL (ref 3.5–5.0)
Alkaline Phosphatase: 105 U/L (ref 38–126)
Bilirubin, Direct: <0.1 mg/dL — ABNORMAL LOW (ref 0.1–0.5)
Total Bilirubin: 0.5 mg/dL (ref 0.3–1.2)
Total Protein: 7.5 g/dL (ref 6.5–8.1)

## 2015-03-01 LAB — TSH: TSH: 1.554 u[IU]/mL (ref 0.350–4.500)

## 2015-03-01 LAB — VALPROIC ACID LEVEL: Valproic Acid Lvl: <10 ug/mL — ABNORMAL LOW (ref 50.0–100.0)

## 2015-03-01 MED ORDER — PREGABALIN 25 MG PO CAPS
25.0000 mg | ORAL_CAPSULE | Freq: Two times a day (BID) | ORAL | Status: DC
  Administered 2015-03-01 – 2015-03-02 (×2): 25 mg via ORAL
  Filled 2015-03-01 (×2): qty 1

## 2015-03-01 MED ORDER — TRAZODONE HCL 50 MG PO TABS
50.0000 mg | ORAL_TABLET | Freq: Every evening | ORAL | Status: DC | PRN
  Administered 2015-03-01 – 2015-03-05 (×5): 50 mg via ORAL
  Filled 2015-03-01 (×2): qty 1
  Filled 2015-03-01: qty 3
  Filled 2015-03-01 (×3): qty 1

## 2015-03-01 MED ORDER — AMITRIPTYLINE HCL 25 MG PO TABS
25.0000 mg | ORAL_TABLET | Freq: Every day | ORAL | Status: DC
  Administered 2015-03-01: 25 mg via ORAL
  Filled 2015-03-01 (×3): qty 1

## 2015-03-01 NOTE — Progress Notes (Addendum)
Patient has been asleep most of the day. When awakened he complains of severe back pain and when ambulating experiencing bilat foot pain. States he has had this pain since 1999 but only recently diagnosed with the nerve pain in feet. He is pleasant though anxious. Assisted in ambulating to bathroom and tray provided. Support and reassurance given. Medicated per orders. Prn robaxin and tylenol given. 1200 clonidine held as patient's VSS and he has been sleeping soundly. Patient's ambulation quite unsteady due to pain therefore fall precautions reviewed and encouraged. Patient verbalized understanding. Denies specific withdrawal other than generalized malaise and fatigue. He denies SI/HI and remains safe. Lawrence Marseilles

## 2015-03-01 NOTE — Progress Notes (Signed)
Patient ID: Jerome Camacho, male   DOB: [birth year], 49 y.o.   MRN: [id number] PER STATE REGULATIONS 482.30  THIS CHART WAS REVIEWED FOR MEDICAL NECESSITY WITH RESPECT TO THE PATIENT'S ADMISSION/DURATION OF STAY.  NEXT REVIEW DATE: 03/04/15  Loura Halt, RN, BSN CASE MANAGER

## 2015-03-01 NOTE — H&P (Signed)
Psychiatric Admission Assessment Adult  Patient Identification: Jerome Camacho MRN:  [id number] Date of Evaluation:  03/01/2015 Chief Complaint:  MDD Substance Dependence Principal Diagnosis: Substance induced mood disorder Diagnosis:   Patient Active Problem List   Diagnosis Date Noted  . Substance induced mood disorder [F19.94] 02/28/2015   History of Present Illness: Jerome Camacho is 49 yo male patient who came in to [facility] accompanied by his wife.  He had expressed suicidal thoughts brought on by chronic pain and abuse of narcotics.  He had suffered an accident in 1990 and had surgery.  Shortly afterwards, he lost his job, his home, the ability to provide for his family and had to get on disability.  He has been on opioids long term and being followed by pain mgmt clinic.  He states he lost his pain MD and unable to afford his meds.  He started buying drugs off the streets and cocaine as a substitute.    He denies ETOH and other substances.  He states he takes percocets and at times Xanax.   He denies SI/HI/AVH.    Elements:  Location:  Substance abuse, depression. Quality:  hopeless, worthless, depressed, anxious. Duration:  chronic, ongoing. Context:  see HPI. Associated Signs/Symptoms: Depression Symptoms:  hopelessness, (Hypo) Manic Symptoms:  Labiality of Mood, Anxiety Symptoms:  Excessive Worry, Psychotic Symptoms:  NA PTSD Symptoms: Na Total Time spent with patient: 45 minutes  Past Medical History: History reviewed. No pertinent past medical history.  Past Surgical History  Procedure Laterality Date  . Back surgery     Family History: History reviewed. No pertinent family history. Social History:  History  Alcohol Use No     History  Drug Use  . Yes    History   Social History  . Marital Status: Married    Spouse Name: N/A  . Number of Children: N/A  . Years of Education: N/A   Social History Main Topics  . Smoking status: Heavy Tobacco Smoker --  4.00 packs/day    Types: Cigarettes  . Smokeless tobacco: Not on file  . Alcohol Use: No  . Drug Use: Yes  . Sexual Activity: No   Other Topics Concern  . None   Social History Narrative   Additional Social History:    Pain Medications: See PTA Prescriptions: See PTA Over the Counter: See PTA History of alcohol / drug use?: Yes Negative Consequences of Use: Financial, Personal relationships, Work / Programmer, multimedia Withdrawal Symptoms: Other (Comment) (anxiety) Name of Substance 1: Pain Pills 1 - Age of First Use: 1999 1 - Amount (size/oz): varies 1 - Frequency: daily 1 - Duration: ongoing 1 - Last Use / Amount: reports has been using daily Name of Substance 2: Cocaine 2 - Age of First Use: "a week ago"  2 - Amount (size/oz): unsure 2 - Frequency: daily 2 - Duration: recently started using cocaine "a week ago"  2 - Last Use / Amount: 2 days  Musculoskeletal: Strength & Muscle Tone: within normal limits Gait & Station: HX OF BACK SURGERY.  Patient is slow. Patient leans: N/A  Psychiatric Specialty Exam: Physical Exam  Vitals reviewed. Constitutional: He is oriented to person, place, and time.  Neurological: He is alert and oriented to person, place, and time.    Review of Systems  Musculoskeletal: Positive for back pain and joint pain.  Psychiatric/Behavioral: Positive for depression. The patient is nervous/anxious.   All other systems reviewed and are negative.   Blood pressure 128/68, pulse 66, temperature  97.7 F (36.5 C), temperature source Oral, resp. rate 16, height 5\' 9"  (1.753 m), weight 119.296 kg (263 lb).Body mass index is 38.82 kg/(m^2).  General Appearance: Disheveled  Eye Solicitor::  Fair  Speech:  Clear and Coherent  Volume:  Normal  Mood:  Anxious, Depressed, Hopeless and Worthless  Affect:  Depressed and Flat  Thought Process:  Intact  Orientation:  Full (Time, Place, and Person)  Thought Content:  Rumination  Suicidal Thoughts:  No  Homicidal  Thoughts:  No  Memory:  Immediate;   Good Recent;   Good Remote;   Good  Judgement:  Good  Insight:  Good  Psychomotor Activity:  Normal  Concentration:  Good  Recall:  Good  Fund of Knowledge:Good  Language: Good  Akathisia:  Negative  Handed:  Right  AIMS (if indicated):     Assets:  Desire for Improvement Resilience  ADL's:  Intact  Cognition: WNL  Sleep:      Risk to Self: Is patient at risk for suicide?: Yes Risk to Others:   Prior Inpatient Therapy:   Prior Outpatient Therapy:    Alcohol Screening: 1. How often do you have a drink containing alcohol?: Never 2. How many drinks containing alcohol do you have on a typical day when you are drinking?: 1 or 2 (reports he does not drink) 3. How often do you have six or more drinks on one occasion?: Never Preliminary Score: 0 4. How often during the last year have you found that you were not able to stop drinking once you had started?: Never 5. How often during the last year have you failed to do what was normally expected from you becasue of drinking?: Never 6. How often during the last year have you needed a first drink in the morning to get yourself going after a heavy drinking session?: Never 7. How often during the last year have you had a feeling of guilt of remorse after drinking?: Never 8. How often during the last year have you been unable to remember what happened the night before because you had been drinking?: Never 9. Have you or someone else been injured as a result of your drinking?: No 10. Has a relative or friend or a doctor or another health worker been concerned about your drinking or suggested you cut down?: No Alcohol Use Disorder Identification Test Final Score (AUDIT): 0 Brief Intervention: AUDIT score less than 7 or less-screening does not suggest unhealthy drinking-brief intervention not indicated  Allergies:   Allergies  Allergen Reactions  . Nsaids Other (See Comments)    Internal bleeding   .  Cymbalta [Duloxetine Hcl] Other (See Comments)    Blood sugar "bottomed out"   Lab Results:  Results for orders placed or performed during the hospital encounter of 02/28/15 (from the past 48 hour(s))  Valproic acid level     Status: Abnormal   Collection Time: 03/01/15  6:35 AM  Result Value Ref Range   Valproic Acid Lvl <10 (L) 50.0 - 100.0 ug/mL    Comment: RESULTS CONFIRMED BY MANUAL DILUTION Performed at [facility]   Hepatic function panel     Status: Abnormal   Collection Time: 03/01/15  6:35 AM  Result Value Ref Range   Total Protein 7.5 6.5 - 8.1 g/dL   Albumin 3.9 3.5 - 5.0 g/dL   AST 17 15 - 41 U/L   ALT 18 17 - 63 U/L   Alkaline Phosphatase 105 38 - 126 U/L  Total Bilirubin 0.5 0.3 - 1.2 mg/dL   Bilirubin, Direct <1.6 (L) 0.1 - 0.5 mg/dL   Indirect Bilirubin NOT CALCULATED 0.3 - 0.9 mg/dL    Comment: Performed at [facility]  TSH     Status: None   Collection Time: 03/01/15  6:35 AM  Result Value Ref Range   TSH 1.554 0.350 - 4.500 uIU/mL    Comment: Performed at [facility]   Current Medications: Current Facility-Administered Medications  Medication Dose Route Frequency Provider Last Rate Last Dose  . acetaminophen (TYLENOL) tablet 650 mg  650 mg Oral Q6H PRN Kerry Hough, PA-C   650 mg at 03/01/15 1311  . alum & mag hydroxide-simeth (MAALOX/MYLANTA) 200-200-20 MG/5ML suspension 30 mL  30 mL Oral Q4H PRN Kerry Hough, PA-C      . amitriptyline (ELAVIL) tablet 25 mg  25 mg Oral QHS Fernando A Cobos, MD      . cloNIDine (CATAPRES) tablet 0.1 mg  0.1 mg Oral QID Kerry Hough, PA-C   0.1 mg at 03/01/15 1407   Followed by  . [START ON 03/03/2015] cloNIDine (CATAPRES) tablet 0.1 mg  0.1 mg Oral BH-qamhs Spencer E Simon, PA-C       Followed by  . [START ON 03/06/2015] cloNIDine (CATAPRES) tablet 0.1 mg  0.1 mg Oral QAC breakfast Kerry Hough, PA-C      . dicyclomine (BENTYL) tablet 20 mg  20 mg Oral Q6H  PRN Kerry Hough, PA-C      . hydrOXYzine (ATARAX/VISTARIL) tablet 50 mg  50 mg Oral Q6H PRN Kerry Hough, PA-C   50 mg at 03/01/15 1405  . loperamide (IMODIUM) capsule 2-4 mg  2-4 mg Oral PRN Kerry Hough, PA-C      . magnesium hydroxide (MILK OF MAGNESIA) suspension 30 mL  30 mL Oral Daily PRN Kerry Hough, PA-C      . methocarbamol (ROBAXIN) tablet 500 mg  500 mg Oral Q8H PRN Kerry Hough, PA-C   500 mg at 03/01/15 1096  . nicotine (NICODERM CQ - dosed in mg/24 hours) patch 21 mg  21 mg Transdermal Daily Craige Cotta, MD   21 mg at 03/01/15 0836  . ondansetron (ZOFRAN-ODT) disintegrating tablet 4 mg  4 mg Oral Q6H PRN Kerry Hough, PA-C      . potassium chloride SA (K-DUR,KLOR-CON) CR tablet 20 mEq  20 mEq Oral BID Kerry Hough, PA-C   20 mEq at 03/01/15 0837  . pregabalin (LYRICA) capsule 25 mg  25 mg Oral BID Craige Cotta, MD      . traZODone (DESYREL) tablet 50 mg  50 mg Oral QHS PRN Craige Cotta, MD       PTA Medications: Prescriptions prior to admission  Medication Sig Dispense Refill Last Dose  . ALPRAZolam (XANAX) 0.5 MG tablet Take 0.5 mg by mouth.   02/27/2015 at Unknown time  . diazepam (VALIUM) 10 MG tablet Take 10 mg by mouth.   Past Week at Unknown time  . gabapentin (NEURONTIN) 800 MG tablet Take 800 mg by mouth at bedtime as needed (pain).   2 Past Week at Unknown time  . morphine (MSIR) 15 MG tablet Take 15 mg by mouth.   Past Week at Unknown time  . Oxycodone HCl 10 MG TABS Take 10 mg by mouth as needed.   02/28/2015 at Unknown time  . oxyCODONE-acetaminophen (PERCOCET/ROXICET) 5-325 MG per tablet Take 2 tablets  by mouth every 6 (six) hours as needed for severe pain.    02/28/2015 at Unknown time  . tizanidine (ZANAFLEX) 6 MG capsule Take 6 mg by mouth 3 (three) times daily as needed for muscle spasms.   Past Week at Unknown time    Previous Psychotropic Medications: Yes   Substance Abuse History in the last 12 months:  Yes.       Consequences of Substance Abuse: inpatient crisis admission  Results for orders placed or performed during the hospital encounter of 02/28/15 (from the past 72 hour(s))  Valproic acid level     Status: Abnormal   Collection Time: 03/01/15  6:35 AM  Result Value Ref Range   Valproic Acid Lvl <10 (L) 50.0 - 100.0 ug/mL    Comment: RESULTS CONFIRMED BY MANUAL DILUTION Performed at [facility]   Hepatic function panel     Status: Abnormal   Collection Time: 03/01/15  6:35 AM  Result Value Ref Range   Total Protein 7.5 6.5 - 8.1 g/dL   Albumin 3.9 3.5 - 5.0 g/dL   AST 17 15 - 41 U/L   ALT 18 17 - 63 U/L   Alkaline Phosphatase 105 38 - 126 U/L   Total Bilirubin 0.5 0.3 - 1.2 mg/dL   Bilirubin, Direct <1.6 (L) 0.1 - 0.5 mg/dL   Indirect Bilirubin NOT CALCULATED 0.3 - 0.9 mg/dL    Comment: Performed at [facility]  TSH     Status: None   Collection Time: 03/01/15  6:35 AM  Result Value Ref Range   TSH 1.554 0.350 - 4.500 uIU/mL    Comment: Performed at [facility]    Observation Level/Precautions:  15 minute checks  Laboratory:  CBC  Psychotherapy:  group  Medications:  As per medlist  Consultations:  As needed  Discharge Concerns:  safety  Estimated LOS:  5-7 days  Other:     Psychological Evaluations: Yes   Treatment Plan Summary: Daily contact with patient to assess and evaluate symptoms and progress in treatment, Medication management and Plan monitor patient's safety.  Added Elavil 25 mg QHS insomnia.  Lyrica 25 mg Pain and depression symptoms,  CIWA protocol.  Clonidine protocol.  Medical Decision Making:  Review of Psycho-Social Stressors (1), Discuss test with performing physician (1), Decision to obtain old records (1), Review and summation of old records (2), New Problem, with no additional work-up planned (3) and Review of Medication Regimen & Side Effects (2)  I certify that inpatient services furnished  can reasonably be expected to improve the patient's condition.   Velna Hatchet May Agustin AGNP-BC 7/21/20164:07 PM  Case reviewed with NP as above Patient seen by me Agree with NP Note and Assessment 49 year old male, reports worsening depression in the context of chronic pain and drug abuse . He has a history of chronic back pain, due to which he states he had to stop working. He describes multiple losses, to include losing his business, home, and being separated from wife. He had been abusing opiates ( no IVDA) and more recently had also been using cocaine. Presented to ED requesting detox and reporting depression as above . Dx- Opiate Dependnce, Cocaine Abuse, Substance Induced Mood Disorder, versus MDD, without psychotic features, chronic pain. Plan- we discussed options - he states neurontin made pain worse. He states cymbalta was not well tolerated and lists it as allergy. He does agree to elavil trial to help address depression and chronic pain,  which is described as neuropathic. He also agrees to trying lyrica. He will be started on Clonidine detox protocol to minimize risk of Opiate WDL.

## 2015-03-01 NOTE — Progress Notes (Signed)
Adult Psychoeducational Group Note  Date:  03/01/2015 Time:  10:10 PM  Group Topic/Focus:    Participation Level:    Participation Quality:    Affect:    Cognitive:    Insight:   Engagement in Group:    Modes of Intervention:    Additional Comments:  Patient did not attend karaoke, stayed back and laid down.   Jerome Camacho 03/01/2015, 10:10 PM

## 2015-03-01 NOTE — BHH Group Notes (Signed)
[facility] Group Therapy 03/01/2015 1:15pm  Type of Therapy: [facility] Presentation  Pt did not attend, declined invitation.   Chad Cordial, LCSWA 03/01/2015 1:29 PM

## 2015-03-01 NOTE — Plan of Care (Signed)
Problem: Ineffective individual coping Goal: STG: Patient will remain free from self harm Outcome: Progressing Patient denies SI and has not engaged in self harm.  Problem: Alteration in mood & ability to function due to Goal: STG-Patient will report withdrawal symptoms Outcome: Progressing Patient complaining of generalized malaise and fatigue related to withdrawal. VSS

## 2015-03-01 NOTE — Progress Notes (Signed)
Patient awake, visibly anxious, restless and shaky. Sitting on the edge of the bed talking with MHT. States pain is still a 10/10. Tylenol given 1 hour ago. Clonidine from 1200 given along with vistaril prn. Encouraged to speak with provider regarding pain options going forward. Patient verbalized understanding. Will continue to support. Jerome Camacho

## 2015-03-01 NOTE — BHH Suicide Risk Assessment (Signed)
[facility] Admission Suicide Risk Assessment   Nursing information obtained from:  Patient Demographic factors:  Caucasian, Unemployed, Access to firearms Current Mental Status:  NA Loss Factors:  Decline in physical health, Financial problems / change in socioeconomic status Historical Factors:  Family history of mental illness or substance abuse, Impulsivity Risk Reduction Factors:  Living with another person, especially a relative Total Time spent with patient: 45 minutes Principal Problem: Substance induced mood disorder Diagnosis:   Patient Active Problem List   Diagnosis Date Noted  . Substance induced mood disorder [F19.94] 02/28/2015     Continued Clinical Symptoms:  Alcohol Use Disorder Identification Test Final Score (AUDIT): 0 The "Alcohol Use Disorders Identification Test", Guidelines for Use in Primary Care, Second Edition.  World Science writer [facility]). Score between 0-7:  no or low risk or alcohol related problems. Score between 8-15:  moderate risk of alcohol related problems. Score between 16-19:  high risk of alcohol related problems. Score 20 or above:  warrants further diagnostic evaluation for alcohol dependence and treatment.   CLINICAL FACTORS:  49 year old male, reports worsening depression in the context of chronic pain and drug abuse . He has a history of chronic back pain, due to which he states he had to stop working. He describes multiple losses, to include losing his business, home, and being separated from wife. He had been abusing opiates ( no IVDA) and more recently had also been using cocaine. Presented to ED requesting detox and reporting depression as above . Dx- Opiate Dependnce, Cocaine Abuse, Substance Induced Mood Disorder, versus MDD, without psychotic features, chronic pain. Plan- we discussed options - he states neurontin made pain worse. He states cymbalta was not well tolerated and lists it as allergy. He does agree to elavil trial to help  address depression and chronic pain, which is described as neuropathic. He also agrees to trying lyrica. He will be started on Clonidine detox protocol to minimize risk of Opiate WDL.    Musculoskeletal: Strength & Muscle Tone: within normal limits Gait & Station: normal Patient leans: N/A  Psychiatric Specialty Exam: Physical Exam  ROS reports cramps, aches, feeling hot and cold, nausea. Denies vomiting, denies diarrhea, no tremors.   Blood pressure 128/68, pulse 66, temperature 97.7 F (36.5 C), temperature source Oral, resp. rate 16, height 5\' 9"  (1.753 m), weight 263 lb (119.296 kg).Body mass index is 38.82 kg/(m^2).  General Appearance: Fairly Groomed  Patent attorney::  Fair  Speech:  Normal Rate  Volume:  Normal  Mood:  Anxious and Depressed  Affect:  Labile  Thought Process:  Goal Directed and Linear  Orientation:  Full (Time, Place, and Person)  Thought Content:  no hallucinations, no delusions   Suicidal Thoughts:  No at this time denies any thoughts of hurting self or any SI  Homicidal Thoughts:  No  Memory:  recent and remote grossly intact   Judgement:  Fair  Insight:  Fair  Psychomotor Activity:  Normal- no tremors, no acute distress or restlessness   Concentration:  Fair  Recall:  Good  Fund of Knowledge:Good  Language: Good  Akathisia:  Negative  Handed:  Right  AIMS (if indicated):     Assets:  Communication Skills Desire for Improvement Resilience  Sleep:     Cognition: WNL  ADL's:  Impaired     COGNITIVE FEATURES THAT CONTRIBUTE TO RISK:  Loss of executive function    SUICIDE RISK:   Moderate:  Frequent suicidal ideation with limited intensity, and duration,  some specificity in terms of plans, no associated intent, good self-control, limited dysphoria/symptomatology, some risk factors present, and identifiable protective factors, including available and accessible social support.  PLAN OF CARE: Patient will be admitted to inpatient psychiatric unit for  stabilization and safety. Will provide and encourage milieu participation. Provide medication management and maked adjustments as needed. Will also provide medication management to minimize risk of WDL.  Will follow daily.    Medical Decision Making:  Review of Psycho-Social Stressors (1), Review or order clinical lab tests (1), Established Problem, Worsening (2) and Review of New Medication or Change in Dosage (2)  I certify that inpatient services furnished can reasonably be expected to improve the patient's condition.   COBOS, Madaline Guthrie 03/01/2015, 5:47 PM

## 2015-03-02 LAB — URINE CULTURE: Culture: NO GROWTH

## 2015-03-02 MED ORDER — AMITRIPTYLINE HCL 50 MG PO TABS
50.0000 mg | ORAL_TABLET | Freq: Every day | ORAL | Status: DC
Start: 2015-03-02 — End: 2015-03-04
  Administered 2015-03-02 – 2015-03-03 (×2): 50 mg via ORAL
  Filled 2015-03-02 (×3): qty 1
  Filled 2015-03-02: qty 2
  Filled 2015-03-02: qty 1

## 2015-03-02 MED ORDER — TRAZODONE HCL 50 MG PO TABS
50.0000 mg | ORAL_TABLET | Freq: Once | ORAL | Status: AC
  Administered 2015-03-02: 50 mg via ORAL

## 2015-03-02 MED ORDER — LIDOCAINE 5 % EX PTCH
1.0000 | MEDICATED_PATCH | Freq: Every day | CUTANEOUS | Status: DC
  Administered 2015-03-02 – 2015-03-03 (×2): 1 via TRANSDERMAL
  Filled 2015-03-02 (×7): qty 1

## 2015-03-02 NOTE — Progress Notes (Signed)
[facility] MD Progress Note  03/02/2015 2:45 PM Jerome Camacho  MRN:  [id number] Subjective:   Patient states he is experiencing some withdrawal symptoms- describes aches, pains, poor appetite, significant nausea, although no vomiting, diarrhea. Also describes chronic back pain, and states that lyrica is not helping and actually causing some unpleasant dysesthesias on her lower extremities . He is craving mostly for opiates and for cigarettes. Objective: I have discussed case with treatment team, have met with patient. Patient is presenting dysphoric, anxious, reporting symptoms of WDL as above . No acute or severe distress noted, but uncomfortable in WDL. He states he has been thinking of going to an inpatient Rehab after discharge and has spoken about this option with his wife. At this time he remains ambivalent. As noted, has chronic lower back pain, and states that lyrica is actually increasing discomfort. Denies other medication side effects. On unit has tended to isolate, with limited milieu/group participation. He often uses humor during session, states he would like to " bust window, so I can go outside for a smoke , then come back", but clarifies that he says this humorously and does not intend any disruptive behaviors . Responds fairly well to support, encouragement. States family members have wanted to come visit him but he is reluctant to allow them to come at this point, stating he prefers for them to see him when he is doing better.  TSH WNL, LFTs unremarkable, Valproic Acid Serum level low .  Principal Problem: Substance induced mood disorder Diagnosis:   Patient Active Problem List   Diagnosis Date Noted  . Substance induced mood disorder [F19.94] 02/28/2015   Total Time spent with patient: 25 minutes    Past Medical History: History reviewed. No pertinent past medical history.  Past Surgical History  Procedure Laterality Date  . Back surgery     Family History: History  reviewed. No pertinent family history. Social History:  History  Alcohol Use No     History  Drug Use  . Yes    History   Social History  . Marital Status: Married    Spouse Name: N/A  . Number of Children: N/A  . Years of Education: N/A   Social History Main Topics  . Smoking status: Heavy Tobacco Smoker -- 4.00 packs/day    Types: Cigarettes  . Smokeless tobacco: Not on file  . Alcohol Use: No  . Drug Use: Yes  . Sexual Activity: No   Other Topics Concern  . None   Social History Narrative   Additional History:    Sleep: Fair  Appetite:  Fair   Assessment:   Musculoskeletal: Strength & Muscle Tone: within normal limits- no distal tremors . Gait & Station: normal Patient leans: N/A   Psychiatric Specialty Exam: Physical Exam  ROS- (+) diarrhea, (+) nausea, muscle aches   Blood pressure 131/85, pulse 99, temperature 98.5 F (36.9 C), temperature source Oral, resp. rate 20, height _0  (1.753 m), weight 263 lb (119.296 kg).Body mass index is 38.82 kg/(m^2).  General Appearance: Fairly Groomed  Engineer, water::  Fair  Speech:  Normal Rate  Volume:  Normal  Mood:  Anxious and Depressed  Affect:  Congruent  Thought Process:  Goal Directed and Linear  Orientation:  Other:  fully alert and attentive, no confusion or delirium  Thought Content:  no hallucinations,no delusions  Suicidal Thoughts:  No denies any SI or self injurious ideations  Homicidal Thoughts:  No  Memory:  recent and remote grossly  intact   Judgement:  Fair  Insight:  Present  Psychomotor Activity:  Normal- slight restlessness  Concentration:  Good  Recall:  Good  Fund of Knowledge:Good  Language: Good  Akathisia:  Negative  Handed:  Right  AIMS (if indicated):     Assets:  Desire for Improvement Resilience  ADL's:  Fair   Cognition: WNL  Sleep:        Current Medications: Current Facility-Administered Medications  Medication Dose Route Frequency Provider Last Rate Last Dose   . acetaminophen (TYLENOL) tablet 650 mg  650 mg Oral Q6H PRN Laverle Hobby, PA-C   650 mg at 03/02/15 1610  . alum & mag hydroxide-simeth (MAALOX/MYLANTA) 200-200-20 MG/5ML suspension 30 mL  30 mL Oral Q4H PRN Laverle Hobby, PA-C      . amitriptyline (ELAVIL) tablet 25 mg  25 mg Oral QHS Jenne Campus, MD   25 mg at 03/01/15 2102  . cloNIDine (CATAPRES) tablet 0.1 mg  0.1 mg Oral QID Laverle Hobby, PA-C   0.1 mg at 03/02/15 1253   Followed by  . [START ON 03/03/2015] cloNIDine (CATAPRES) tablet 0.1 mg  0.1 mg Oral BH-qamhs Spencer E Simon, PA-C       Followed by  . [START ON 03/06/2015] cloNIDine (CATAPRES) tablet 0.1 mg  0.1 mg Oral QAC breakfast Laverle Hobby, PA-C      . dicyclomine (BENTYL) tablet 20 mg  20 mg Oral Q6H PRN Laverle Hobby, PA-C      . hydrOXYzine (ATARAX/VISTARIL) tablet 50 mg  50 mg Oral Q6H PRN Laverle Hobby, PA-C   50 mg at 03/02/15 1153  . loperamide (IMODIUM) capsule 2-4 mg  2-4 mg Oral PRN Laverle Hobby, PA-C   4 mg at 03/02/15 1411  . magnesium hydroxide (MILK OF MAGNESIA) suspension 30 mL  30 mL Oral Daily PRN Laverle Hobby, PA-C      . methocarbamol (ROBAXIN) tablet 500 mg  500 mg Oral Q8H PRN Laverle Hobby, PA-C   500 mg at 03/02/15 0926  . nicotine (NICODERM CQ - dosed in mg/24 hours) patch 21 mg  21 mg Transdermal Daily Jenne Campus, MD   21 mg at 03/02/15 0917  . ondansetron (ZOFRAN-ODT) disintegrating tablet 4 mg  4 mg Oral Q6H PRN Laverle Hobby, PA-C      . pregabalin (LYRICA) capsule 25 mg  25 mg Oral BID Jenne Campus, MD   25 mg at 03/02/15 0903  . traZODone (DESYREL) tablet 50 mg  50 mg Oral QHS PRN Jenne Campus, MD   50 mg at 03/01/15 2102    Lab Results:  Results for orders placed or performed during the hospital encounter of 02/28/15 (from the past 48 hour(s))  Valproic acid level     Status: Abnormal   Collection Time: 03/01/15  6:35 AM  Result Value Ref Range   Valproic Acid Lvl <10 (L) 50.0 - 100.0 ug/mL     Comment: RESULTS CONFIRMED BY MANUAL DILUTION Performed at [facility]   Hepatic function panel     Status: Abnormal   Collection Time: 03/01/15  6:35 AM  Result Value Ref Range   Total Protein 7.5 6.5 - 8.1 g/dL   Albumin 3.9 3.5 - 5.0 g/dL   AST 17 15 - 41 U/L   ALT 18 17 - 63 U/L   Alkaline Phosphatase 105 38 - 126 U/L   Total Bilirubin 0.5 0.3 - 1.2 mg/dL  Bilirubin, Direct <0.1 (L) 0.1 - 0.5 mg/dL   Indirect Bilirubin NOT CALCULATED 0.3 - 0.9 mg/dL    Comment: Performed at [facility]  TSH     Status: None   Collection Time: 03/01/15  6:35 AM  Result Value Ref Range   TSH 1.554 0.350 - 4.500 uIU/mL    Comment: Performed at [facility]    Physical Findings: AIMS: Facial and Oral Movements Muscles of Facial Expression: None, normal Lips and Perioral Area: None, normal Jaw: None, normal Tongue: None, normal,Extremity Movements Upper (arms, wrists, hands, fingers): None, normal Lower (legs, knees, ankles, toes): None, normal, Trunk Movements Neck, shoulders, hips: None, normal, Overall Severity Severity of abnormal movements (highest score from questions above): None, normal Incapacitation due to abnormal movements: None, normal Patient's awareness of abnormal movements (rate only patient's report): No Awareness, Dental Status Current problems with teeth and/or dentures?: No Does patient usually wear dentures?: No  CIWA:  CIWA-Ar Total: 2 COWS:  COWS Total Score: 5   Assessment- at this time patient remains dysphoric, depressed, but not suicidal . He continues to present with some withdrawal symptoms, particularly opiate WDL symptoms such as diarrhea, nausea. He has cravings for opiates , but expresses motivation in sobriety and is thinking of going to a 28 day program after discharge. He has chronic pain, and perceives Lyrica as worsening pain- wants to stop it.   Treatment Plan Summary: Daily contact with  patient to assess and evaluate symptoms and progress in treatment, Medication management, Plan inpatient admission and medications as below Continue CLonidine / opiate detox protocol to minimize , manage withdrawal symptoms D/C Lyrica  As patient perceives it is not helping pain and causing side effects. Agrees to Lidoderm patch to affected back area to address pain. Increase Elavil to 50 mgrs QHS to address depression, chronic pain, and help with sleep. Continue Nicoderm patch to minimize nicotine WDL/cravings  Regarding substance abuse history- encourage ongoing efforts to maintain sobriety and continue to encourage importance of ongoing treatment after discharge , to include considering Rehab .  Medical Decision Making:  Established Problem, Stable/Improving (1), Review of Psycho-Social Stressors (1), Review or order clinical lab tests (1) and Review of Medication Regimen & Side Effects (2)     Lynden Flemmer 03/02/2015, 2:45 PM

## 2015-03-02 NOTE — Progress Notes (Signed)
Adult Psychoeducational Group Note  Date:  03/02/2015 Time:  9:36 PM  Group Topic/Focus:  Wrap-Up Group:   The focus of this group is to help patients review their daily goal of treatment and discuss progress on daily workbooks.  Participation Level:  Active  Participation Quality:  Appropriate  Affect:  Appropriate  Cognitive:  Appropriate  Insight: Appropriate and Good  Engagement in Group:  Engaged  Modes of Intervention:  Discussion  Additional Comments:  Patient rated his day a 5, he stated that he was in pain most of the day which caused him some irritation. He stated his goal was to quit depending on drugs.  [name] 03/02/2015, 9:36 PM

## 2015-03-02 NOTE — Progress Notes (Signed)
Pt has woken up periodically throughout the night.  According to his check sheet, he slept 4 hours during the day yesterday.  He has been irritable at times, making statements such as "I'm getting out of here tonight, I need a cigarette, a shot of morphine, or some liquor and I don't even drink; I'm going to bust this window out."  Pt will then joke around with staff at times.  Pt has stayed in his bed throughout the night.  He has received PRN medications for pain and anxiety.  On-call provider was notified that pt was having difficulty sleeping earlier tonight and Trazodone 50 mg POX1 was ordered and administered.  At this time, pt is resting in his bed with eyes closed.  Respirations are even and unlabored.  Pt appears to be asleep.  Will continue to monitor and assess for safety.

## 2015-03-02 NOTE — Plan of Care (Signed)
Problem: Alteration in mood Goal: LTG-Patient reports reduction in suicidal thoughts (Patient reports reduction in suicidal thoughts and is able to verbalize a safety plan for whenever patient is feeling suicidal)  Outcome: Progressing Pt denied SI this shift.  He verbally contracted for safety.       

## 2015-03-02 NOTE — [facility] Group Notes (Signed)
[facility] LCSW Group Therapy 03/02/2015 1:15pm  Type of Therapy: Group Therapy- Feelings Around Relapse and Recovery  Pt did not attend, declined invitation.   Chad Cordial, Theresia Majors [phone] 03/02/2015 2:49 PM

## 2015-03-02 NOTE — Progress Notes (Signed)
D: Pt has anxious affect and mood.  Pt reports reports he had a "rough day" related to withdrawing.  He reports he "may be going to a 28 day program after here."  Pt is restless, shaking his leg upon initial assessment.  He reports he did not attend groups today.  Pt denies SI/HI, denies hallucinations, reports back pain on bilateral foot pain of 10/10.  Pt stayed in his room for the majority of the shift.  He did not attend evening group.   A: Met with pt 1:1 and provided support and encouragement.  Actively listened to pt.  Medications administered per order.  PRN medication administered for pain and sleep. R: Pt is compliant with medications.  Pt verbally contracts for safety.  Will continue to monitor and assess.

## 2015-03-02 NOTE — Progress Notes (Signed)
CSW attempted to see patient today x2 to complete assessment. Patient was unable to be aroused- sleeping soundly- per RN, detox'ing.   Reece Levy, MSW, LCSW

## 2015-03-02 NOTE — [facility] Group Notes (Signed)
[facility] LCSW Aftercare Discharge Planning Group Note  03/02/2015 8:45 AM  Pt did not attend, declined invitation.   Chad Cordial, LCSWA 03/02/2015 9:33 AM

## 2015-03-02 NOTE — Progress Notes (Addendum)
D) Pt. Slow to move this morning.  Remained in bed through breakfast and morning group stating he was in too much pain to get out of bed. 10/10 pain.  Up and out of bed by midday, showered, and more social in dayroom.  Pt. Utilizing PRN medication for c/o pain, diarrhea, anxiety, and general malaise. Pt. Reports conflict with wife who got "tired of putting up with my sh*t".   A) Support offered.  Comfort measures provided.  PRN medications given per order. R) Pt. Continues to report little relief from any measures and states pain is still 9/10, yet appears more comfortable and is moving more easily. Pt. Contracts for safety and is safe at this time.

## 2015-03-02 NOTE — Progress Notes (Signed)
CSW was unable to assess patient today- as he was unavailable at time of visit. Will ask weekend CSW to see tomorrow.    Reece Levy, MSW, Theresia Majors

## 2015-03-02 NOTE — Progress Notes (Signed)
D: Patient seen on day room watching TV and interacting with peers. Patient denies pain and any withdrawal symptoms at this time. Patient stated "I am very much better". Patient reported insomnia and requested for a sleeping pill before bed time. No new complaint.  A: Support and encouragement offered to patient. Due medications given as ordered. Every 15 minutes check for safety maintained. Will continue to monitor for safety and stability. R: Patient remains safe.

## 2015-03-02 NOTE — Progress Notes (Signed)
Recreation Therapy Notes  Date: 07.22.16 Time: 9:30 am Location: [address] Group Room  Group Topic: Stress Management  Goal Area(s) Addresses:  Patient will verbalize importance of using healthy stress management.  Patient will identify positive emotions associated with healthy stress management.   Intervention: Stress Management   Activity :  Guided Imagery Script.  LRT introduced and educated patients on stress management  Technique of guided imagery.  Camacho script was used to deliver the technique to patients.  Patients were asked to follow script read Camacho loud by LRT to engage in practicing the stress management technique.  Education:  Stress Management, Discharge Planning.   Education Outcome: Acknowledges edcuation/In group clarification offered/Needs additional education  Clinical Observations/Feedback: Patient did not attend group.   Darrell Hauk, LRT/CTRS         Jerome Camacho 03/02/2015 3:33 PM 

## 2015-03-03 DIAGNOSIS — F1123 Opioid dependence with withdrawal: Secondary | ICD-10-CM

## 2015-03-03 MED ORDER — TRAMADOL HCL 50 MG PO TABS
50.0000 mg | ORAL_TABLET | Freq: Four times a day (QID) | ORAL | Status: DC | PRN
  Administered 2015-03-03 – 2015-03-05 (×6): 50 mg via ORAL
  Filled 2015-03-03 (×6): qty 1

## 2015-03-03 NOTE — [facility] Group Notes (Addendum)
[facility] LCSW Group Therapy  03/03/2015 1:15 PM  Type of Therapy:  Group Therapy  Participation Level:  Did Not attend although encouraged to by CSW   Clide Dales 03/03/2015, 1:15 PM
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
## 2015-03-03 NOTE — [facility] Group Notes (Signed)
Psychoeducational Group Note  Date:  03/03/2015 Time:  1000  Group Topic/Focus:  Goals Group:   The focus of this group is to help patients establish daily goals to achieve during treatment and discuss how the patient can incorporate goal setting into their daily lives to aide in recovery.  Participation Level: Minimal  Participation Quality: Poor  Affect: Anxious  Cognitive:  Poor problem solving/coping mechanisms; lack of readiness for sobriety.  Insight:  Poor  Engagement in Group: Not engaged.  Additional Comments: Pt is focused on his pain management and not engaged in group.  He finally set a group goal of identifying people who need him in their lives, as suggested by another patient but does not appear vested.  Altamease Oiler 03/03/2015, 11:25 AM

## 2015-03-03 NOTE — Progress Notes (Signed)
Patient ID: Jerome Camacho, male   DOB: 11-Dec-1965, 49 y.o.   MRN: [id number] [facility] MD Progress Note  03/03/2015 10:57 AM Jerome Camacho  MRN:  [id number] Subjective:   "I don't know what to do with my pain."  He is craving mostly for opiates and for cigarettes. Objective: I have discussed case with treatment team, have met with patient. Patient is anxious and overly focused on pain management and symptoms. No acute or severe distress noted, but uncomfortable in WDL. Denies other medication side effects. On unit has tended to isolate, with limited milieu/group participation. Jokes during session, states he would like to " bust window or door to get a cigarette, but clarifies that he says this humorously and does not intend any disruptive behaviors . Responds fairly well to support, encouragement.  Principal Problem: Substance induced mood disorder Diagnosis:   Patient Active Problem List   Diagnosis Date Noted  . Substance induced mood disorder [F19.94] 02/28/2015   Total Time spent with patient: 25 minutes    Past Medical History: History reviewed. No pertinent past medical history.  Past Surgical History  Procedure Laterality Date  . Back surgery     Family History: History reviewed. No pertinent family history. Social History:  History  Alcohol Use No     History  Drug Use  . Yes    History   Social History  . Marital Status: Married    Spouse Name: N/A  . Number of Children: N/A  . Years of Education: N/A   Social History Main Topics  . Smoking status: Heavy Tobacco Smoker -- 4.00 packs/day    Types: Cigarettes  . Smokeless tobacco: Not on file  . Alcohol Use: No  . Drug Use: Yes  . Sexual Activity: No   Other Topics Concern  . None   Social History Narrative   Additional History:    Sleep: Fair  Appetite:  Fair   Assessment:  Jerome Camacho is a 49 year old admitted with depression.  He also has chronic back pain.  Musculoskeletal: Strength &  Muscle Tone: within normal limits- no distal tremors . Gait & Station: normal Patient leans: N/A   Psychiatric Specialty Exam: Physical Exam  Vitals reviewed. Psychiatric: His mood appears anxious.    Review of Systems  All other systems reviewed and are negative. - (+) diarrhea, (+) nausea, muscle aches   Blood pressure 115/76, pulse 89, temperature 98 F (36.7 C), temperature source Oral, resp. rate 18, height _0  (1.753 m), weight 119.296 kg (263 lb).Body mass index is 38.82 kg/(m^2).  General Appearance: Fairly Groomed  Engineer, water::  Fair  Speech:  Normal Rate  Volume:  Normal  Mood:  Anxious and Depressed  Affect:  Congruent  Thought Process:  Goal Directed and Linear  Orientation:  Other:  fully alert and attentive, no confusion or delirium  Thought Content:  no hallucinations,no delusions  Suicidal Thoughts:  No denies any SI or self injurious ideations.  Does feel like punching a hole through a wall when pain worsens throughout day  Homicidal Thoughts:  No  Memory:  recent and remote grossly intact   Judgement:  Fair  Insight:  Present  Psychomotor Activity:  Normal- slight restlessness  Concentration:  Good  Recall:  Good  Fund of Knowledge:Good  Language: Good  Akathisia:  Negative  Handed:  Right  AIMS (if indicated):     Assets:  Desire for Improvement Resilience  ADL's:  Fair   Cognition: WNL  Sleep:        Current Medications: Current Facility-Administered Medications  Medication Dose Route Frequency Provider Last Rate Last Dose  . acetaminophen (TYLENOL) tablet 650 mg  650 mg Oral Q6H PRN Laverle Hobby, PA-C   650 mg at 03/03/15 8811  . alum & mag hydroxide-simeth (MAALOX/MYLANTA) 200-200-20 MG/5ML suspension 30 mL  30 mL Oral Q4H PRN Laverle Hobby, PA-C      . amitriptyline (ELAVIL) tablet 50 mg  50 mg Oral QHS Jenne Campus, MD   50 mg at 03/02/15 2122  . cloNIDine (CATAPRES) tablet 0.1 mg  0.1 mg Oral QID Laverle Hobby, PA-C   0.1 mg at  03/03/15 0315   Followed by  . cloNIDine (CATAPRES) tablet 0.1 mg  0.1 mg Oral BH-qamhs Laverle Hobby, PA-C       Followed by  . [START ON 03/06/2015] cloNIDine (CATAPRES) tablet 0.1 mg  0.1 mg Oral QAC breakfast Laverle Hobby, PA-C      . dicyclomine (BENTYL) tablet 20 mg  20 mg Oral Q6H PRN Laverle Hobby, PA-C      . hydrOXYzine (ATARAX/VISTARIL) tablet 50 mg  50 mg Oral Q6H PRN Laverle Hobby, PA-C   50 mg at 03/03/15 0906  . lidocaine (LIDODERM) 5 % 1 patch  1 patch Transdermal Daily Jenne Campus, MD   1 patch at 03/03/15 [phone]  . loperamide (IMODIUM) capsule 2-4 mg  2-4 mg Oral PRN Laverle Hobby, PA-C   2 mg at 03/03/15 5929  . magnesium hydroxide (MILK OF MAGNESIA) suspension 30 mL  30 mL Oral Daily PRN Laverle Hobby, PA-C      . methocarbamol (ROBAXIN) tablet 500 mg  500 mg Oral Q8H PRN Laverle Hobby, PA-C   500 mg at 03/02/15 0926  . nicotine (NICODERM CQ - dosed in mg/24 hours) patch 21 mg  21 mg Transdermal Daily Jenne Campus, MD   21 mg at 03/03/15 0815  . ondansetron (ZOFRAN-ODT) disintegrating tablet 4 mg  4 mg Oral Q6H PRN Laverle Hobby, PA-C      . traZODone (DESYREL) tablet 50 mg  50 mg Oral QHS PRN Jenne Campus, MD   50 mg at 03/02/15 2214    Lab Results:  No results found for this or any previous visit (from the past 48 hour(s)).  Physical Findings: AIMS: Facial and Oral Movements Muscles of Facial Expression: None, normal Lips and Perioral Area: None, normal Jaw: None, normal Tongue: None, normal,Extremity Movements Upper (arms, wrists, hands, fingers): None, normal Lower (legs, knees, ankles, toes): None, normal, Trunk Movements Neck, shoulders, hips: None, normal, Overall Severity Severity of abnormal movements (highest score from questions above): None, normal Incapacitation due to abnormal movements: None, normal Patient's awareness of abnormal movements (rate only patient's report): No Awareness, Dental Status Current problems with teeth  and/or dentures?: No Does patient usually wear dentures?: No  CIWA:  CIWA-Ar Total: 2 COWS:  COWS Total Score: 7   Assessment- at this time patient remains dysphoric, depressed, but not suicidal . He continues to present with some withdrawal symptoms, particularly opiate WDL symptoms such as diarrhea, nausea. He has chronic pain.  Started on Ultram to help with pain.   Treatment Plan Summary: Daily contact with patient to assess and evaluate symptoms and progress in treatment, Medication management, Plan inpatient admission and medications as below Continue Clonidine / opiate detox protocol to minimize , manage withdrawal symptoms  Lidoderm patch to affected  back area to address pain. Elavil to 50 mgrs QHS to address depression, chronic pain, and help with sleep.  Nicoderm patch to minimize nicotine WDL/cravings  Regarding substance abuse history- encourage ongoing efforts to maintain sobriety and continue to encourage importance of ongoing treatment after discharge , to include considering Rehab. Started Tramadol 50 mg Q6hrs for 8 doses  Medical Decision Making:  Established Problem, Stable/Improving (1), Review of Psycho-Social Stressors (1), Review or order clinical lab tests (1) and Review of Medication Regimen & Side Effects (2)  Freda Munro May Agustin AGNP-BC 03/03/2015, 10:57 AM Patient seen face-to-face for psychiatric evaluation, chart reviewed and case discussed with the physician extender and developed treatment plan. Reviewed the information documented and agree with the treatment plan. Corena Pilgrim, MD

## 2015-03-03 NOTE — Progress Notes (Signed)
[facility] Group Notes:  (Nursing/MHT/Case Management/Adjunct)  Date:  03/03/2015  Time:  11:14 PM  Type of Therapy:  Group Therapy  Participation Level:  Active  Participation Quality:  Appropriate  Affect:  Appropriate  Cognitive:  Appropriate  Insight:  Appropriate  Engagement in Group:  Engaged  Modes of Intervention:  Socialization and Support  Summary of Progress/Problems: Pt. Was engaged and participated in group discussion.  Pt. Stated he would spend time with his support system when he is discharged.  Sondra Come 03/03/2015, 11:14 PM

## 2015-03-04 MED ORDER — DICLOFENAC SODIUM 1 % TD GEL
2.0000 g | Freq: Four times a day (QID) | TRANSDERMAL | Status: DC
  Administered 2015-03-04 – 2015-03-06 (×6): 2 g via TOPICAL
  Filled 2015-03-04 (×2): qty 100

## 2015-03-04 MED ORDER — AMITRIPTYLINE HCL 75 MG PO TABS
75.0000 mg | ORAL_TABLET | Freq: Every day | ORAL | Status: DC
  Administered 2015-03-04 – 2015-03-05 (×2): 75 mg via ORAL
  Filled 2015-03-04 (×2): qty 1
  Filled 2015-03-04: qty 3
  Filled 2015-03-04: qty 1

## 2015-03-04 NOTE — Progress Notes (Signed)
Pt presents with irritable mood, affect congruent. Jerome Camacho continues to report being ''frustrated '' at being here stating ''I'm in pain and I'm ready to leave, I can't believe they put me on commitment and then aren't giving me enough drugs. Give me whatever I can have for my nerves and my pain. '' patient denies any SI, he has been visible in the dayroom but continues to request pain and anxiety medications despite being seen in the dayroom laughing with peers at times. He was allowed to ventilate, support encouragement given. Pt compliant with medications this am,. Received several prn medications for withdrawal symptoms. Encouraged to discuss requests for further medications with May NP. Pt is safe, will continue to monitor q 15 minutes as ordered.

## 2015-03-04 NOTE — [facility] Group Notes (Signed)
[facility] Group Notes:  (Nursing/MHT/Case Management/Adjunct)  Date:  03/04/2015  Time:  10:45 AM  Type of Therapy:  Psychoeducational Skills  Participation Level:  Active  Participation Quality:  Inattentive  Affect:  Irritable  Cognitive:  Appropriate  Insight:  Appropriate  Engagement in Group:  Lacking  Modes of Intervention:  Discussion and Education  Summary of Progress/Problems:  Jerome Camacho 03/04/2015, 10:45 AM

## 2015-03-04 NOTE — [facility] Group Notes (Signed)
[facility] LCSW Group Therapy  03/04/2015 1:15 PM  Type of Therapy:  Group Therapy  Participation Level:  Did Not Attend although CSW went into pt's room and encouraged him to come to group.   Summary of Progress/Problems: The main focus of today's process group was to identify the patient's current support system and decide on other supports that can be put in place. An emphasis was placed on using counselor, doctor, therapy groups, 12-step groups, and problem-specific support groups to expand supports. There was also an extensive discussion about what constitutes a healthy support versus an unhealthy support. Pt did not attend.   Carney Bern, LCSW

## 2015-03-04 NOTE — Progress Notes (Signed)
Adult Psychoeducational Group Note  Date:  03/04/2015 Time:  9:57 PM  Group Topic/Focus:    Participation Level:    Participation Quality:    Affect:    Cognitive:    Insight:   Engagement in Group:    Modes of Intervention:    Additional Comments: Patient did not attend group.  [name] 03/04/2015, 9:57 PM

## 2015-03-04 NOTE — Progress Notes (Signed)
D: Patient seen on day room socializing and interacting with peers. Patient reports "no change in his condition". Reports generalized body pain stated "give me every pain medicine I can get" laughing hilariously. Denies SI, ah/vh. Patient's behavior this evening is somewhat silly. Was verbally redirected from trying to touch this writer inappropriately. Patient later apologize to this writer stated "I don;t mean to touch you that way; I was trying to express my heart felt gratitude for yesterday. The medicine you gave me work really well. I slept good".  A: Patient encouraged to continue with the treatment plan. Due medications given as ordered. Every 15 minutes check for safety maintained. Will continue to monitor patient for safety and stability.  R: Patient is safe.

## 2015-03-04 NOTE — Progress Notes (Signed)
[facility] MD Progress Note  03/04/2015 12:19 PM Jerome Camacho  MRN:  [id number] Subjective:   "I need a cigarette.  Pain is wearing my ass out." Objective: I have discussed case with treatment team, have met with patient. Patient is persistently anxious and overly focused on pain management and symptoms. No acute or severe distress noted, but uncomfortable in WDL. Denies other medication side effects. On unit has tended to isolate, with limited milieu/group participation. Jokes during session, states he would like to " bust window or door to get a cigarette, but clarifies that he says this humorously and does not intend any disruptive behaviors . Responds fairly well to support, encouragement. Patient is worried about his IVC papers showing he has mental illness and it shows IVC.  Patient wants it noted that he came in voluntarily.  Principal Problem: Substance induced mood disorder Diagnosis:   Patient Active Problem List   Diagnosis Date Noted  . Substance induced mood disorder [F19.94] 02/28/2015   Total Time spent with patient: 25 minutes    Past Medical History: History reviewed. No pertinent past medical history.  Past Surgical History  Procedure Laterality Date  . Back surgery     Family History: History reviewed. No pertinent family history. Social History:  History  Alcohol Use No     History  Drug Use  . Yes    History   Social History  . Marital Status: Married    Spouse Name: N/A  . Number of Children: N/A  . Years of Education: N/A   Social History Main Topics  . Smoking status: Heavy Tobacco Smoker -- 4.00 packs/day    Types: Cigarettes  . Smokeless tobacco: Not on file  . Alcohol Use: No  . Drug Use: Yes  . Sexual Activity: No   Other Topics Concern  . None   Social History Narrative   Additional History:    Sleep: Fair  Appetite:  Fair   Assessment:  Jerome Camacho is a 49 year old admitted with depression.  He also has chronic back  pain.  Musculoskeletal: Strength & Muscle Tone: within normal limits- no distal tremors . Gait & Station: normal Patient leans: N/A   Psychiatric Specialty Exam: Physical Exam  Vitals reviewed. Psychiatric: His mood appears anxious.    Review of Systems  All other systems reviewed and are negative. - (+) diarrhea, (+) nausea, muscle aches   Blood pressure 120/81, pulse 72, temperature 97.8 F (36.6 C), temperature source Oral, resp. rate 18, height  (1.753 m), weight 119.296 kg (263 lb).Body mass index is 38.82 kg/(m^2).  General Appearance: Fairly Groomed  Patent attorney::  Fair  Speech:  Normal Rate  Volume:  Normal  Mood:  Anxious and Depressed  Affect:  Congruent  Thought Process:  Goal Directed and Linear  Orientation:  Other:  fully alert and attentive, no confusion or delirium  Thought Content:  no hallucinations,no delusions  Suicidal Thoughts:  No denies any SI or self injurious ideations.  Does feel like punching a hole through a wall when pain worsens throughout day  Homicidal Thoughts:  No  Memory:  recent and remote grossly intact   Judgement:  Fair  Insight:  Present  Psychomotor Activity:  Normal- slight restlessness  Concentration:  Good  Recall:  Good  Fund of Knowledge:Good  Language: Good  Akathisia:  Negative  Handed:  Right  AIMS (if indicated):     Assets:  Desire for Improvement Resilience  ADL's:  Morene Antu  Cognition: WNL  Sleep:  Number of Hours: 6.25   Current Medications: Current Facility-Administered Medications  Medication Dose Route Frequency Provider Last Rate Last Dose  . acetaminophen (TYLENOL) tablet 650 mg  650 mg Oral Q6H PRN Laverle Hobby, PA-C   650 mg at 03/03/15 1456  . alum & mag hydroxide-simeth (MAALOX/MYLANTA) 200-200-20 MG/5ML suspension 30 mL  30 mL Oral Q4H PRN Laverle Hobby, PA-C      . amitriptyline (ELAVIL) tablet 50 mg  50 mg Oral QHS Jenne Campus, MD   50 mg at 03/03/15 2117  . cloNIDine (CATAPRES) tablet  0.1 mg  0.1 mg Oral BH-qamhs Spencer E Simon, PA-C   0.1 mg at 03/04/15 0800   Followed by  . [START ON 03/06/2015] cloNIDine (CATAPRES) tablet 0.1 mg  0.1 mg Oral QAC breakfast Laverle Hobby, PA-C      . dicyclomine (BENTYL) tablet 20 mg  20 mg Oral Q6H PRN Laverle Hobby, PA-C      . hydrOXYzine (ATARAX/VISTARIL) tablet 50 mg  50 mg Oral Q6H PRN Laverle Hobby, PA-C   50 mg at 03/04/15 0839  . lidocaine (LIDODERM) 5 % 1 patch  1 patch Transdermal Daily Jenne Campus, MD   1 patch at 03/03/15 [phone]  . loperamide (IMODIUM) capsule 2-4 mg  2-4 mg Oral PRN Laverle Hobby, PA-C   4 mg at 03/04/15 2585  . magnesium hydroxide (MILK OF MAGNESIA) suspension 30 mL  30 mL Oral Daily PRN Laverle Hobby, PA-C      . methocarbamol (ROBAXIN) tablet 500 mg  500 mg Oral Q8H PRN Laverle Hobby, PA-C   500 mg at 03/04/15 0839  . nicotine (NICODERM CQ - dosed in mg/24 hours) patch 21 mg  21 mg Transdermal Daily Myer Peer Cobos, MD   21 mg at 03/04/15 0800  . ondansetron (ZOFRAN-ODT) disintegrating tablet 4 mg  4 mg Oral Q6H PRN Laverle Hobby, PA-C      . traMADol Veatrice Bourbon) tablet 50 mg  50 mg Oral Q6H PRN Kerrie Buffalo, NP   50 mg at 03/04/15 0800  . traZODone (DESYREL) tablet 50 mg  50 mg Oral QHS PRN Jenne Campus, MD   50 mg at 03/03/15 2117    Lab Results:  No results found for this or any previous visit (from the past 31 hour(s)).  Physical Findings: AIMS: Facial and Oral Movements Muscles of Facial Expression: None, normal Lips and Perioral Area: None, normal Jaw: None, normal Tongue: None, normal,Extremity Movements Upper (arms, wrists, hands, fingers): None, normal Lower (legs, knees, ankles, toes): None, normal, Trunk Movements Neck, shoulders, hips: None, normal, Overall Severity Severity of abnormal movements (highest score from questions above): None, normal Incapacitation due to abnormal movements: None, normal Patient's awareness of abnormal movements (rate only patient's  report): No Awareness, Dental Status Current problems with teeth and/or dentures?: No Does patient usually wear dentures?: No  CIWA:  CIWA-Ar Total: 2 COWS:  COWS Total Score: 10   Assessment- at this time patient remains dysphoric, depressed, but not suicidal . He continues to present with some withdrawal symptoms, particularly opiate WDL symptoms such as diarrhea, nausea. He has chronic pain.  Started on Ultram to help with pain.   Treatment Plan Summary: Daily contact with patient to assess and evaluate symptoms and progress in treatment, Medication management, Plan inpatient admission and medications as below Continue Clonidine / opiate detox protocol to minimize , manage withdrawal symptoms Lidoderm patch to  affected back area to address pain. Increased Elavil to 75 mgrs QHS to address depression, chronic pain, and help with sleep. Nicoderm patch to minimize nicotine WDL/cravings  Regarding substance abuse history- encourage ongoing efforts to maintain sobriety and continue to encourage importance of ongoing treatment after discharge , to include considering Rehab. Started Tramadol 50 mg Q6hrs for 8 doses Voltaren gel for applying to affected areas  Medical Decision Making:  Established Problem, Stable/Improving (1), Review of Psycho-Social Stressors (1), Review or order clinical lab tests (1) and Review of Medication Regimen & Side Effects (2)  Sheila May Roscoe AGNP-BC 03/04/2015, 12:19 PM Patient seen face-to-face for psychiatric evaluation, chart reviewed and case discussed with the physician extender and developed treatment plan. Reviewed the information documented and agree with the treatment plan. Corena Pilgrim, MD

## 2015-03-05 NOTE — [facility] Group Notes (Signed)
[facility] LCSW Group Therapy  03/05/2015 1:15pm  Type of Therapy:  Group Therapy vercoming Obstacles  Pt did not attend, declined invitation.   Chad Cordial, LCSWA 03/05/2015 4:22 PM

## 2015-03-05 NOTE — Progress Notes (Signed)
Did not attend group, remained in his room.

## 2015-03-05 NOTE — Progress Notes (Signed)
D:  Patient's self inventory sheet, patient sleeps good, sleep medication is helpful.  Fair appetite, normal energy level, good concentration.  Rated depression 5, hopeless 2, anxiety 8.  Denied withdrawals.  Denied SI.  Physical problems, lightheaded, pain, headache.  Pain worst in past 24 hours is #9, back, feet pain.  Goal is to go home.  Plan is to wait.   A:  Medications administered per MD orders.  Emotional support and encouragement given patient. R:  Denied SI and HI, contracts for safety.  Denied A/V hallucinations.  Safety maintained with 15 minute checks.

## 2015-03-05 NOTE — Progress Notes (Signed)
D: Jerome Camacho spent most of his time in the room. Only came for medications and snacks. Patient stated "I am a little bit tired. I just want to lay down a bit". Complained of generalized body pain. Rated depression 2/10, ANXIETY 2/10. Denies AH/VH. A: Support and encouragement offered to patient. Due medications given as ordered. Every 15 minutes check for safety maintained. Will continue to monitor patient.  R: Patient remains safe.

## 2015-03-05 NOTE — Clinical Social Work Note (Signed)
Per wife, patient's primary problem is lack of pain management services, was at [facility] (Dr Charyl Dancer?) and was referred to [facility] (Dr Tyler Deis) - was unable to seen because they didn't have records transferred from [facility].  Wife requests that CSW follow up [facility] to see whether he has upcoming appointment.  "I would do anything to get my husband back, we have lost everything including our home and business" due to chronic pain issues.  Wife has seen [facility] in [place] and would like patient referred there if possible.  CSW will investigate possible referrals w patient.  Santa Genera, LCSW Clinical Social Worker

## 2015-03-05 NOTE — [facility] Group Notes (Signed)
[facility] LCSW Aftercare Discharge Planning Group Note  03/05/2015 8:45 AM  Pt did not attend, declined invitation.   Chad Cordial, LCSWA 03/05/2015 1:04 PM

## 2015-03-05 NOTE — Plan of Care (Signed)
Problem: Consults Goal: Suicide Risk Patient Education (See Patient Education module for education specifics)  Outcome: Completed/Met Date Met:  03/05/15 Nurse discussed suicidal thoughts/coping skills with patient.

## 2015-03-05 NOTE — Progress Notes (Addendum)
Patient ID: Jerome Camacho, male   DOB: 1965-08-13, 49 y.o.   MRN: [id number] [facility] MD Progress Note  03/05/2015 1:47 PM Jerome Camacho  MRN:  [id number] Subjective:     Patient reports that overall he is feeling better. He states his mood is less depressed. His pain , which is chronic, is described as " about the same", but he does state he feels subjectively better and " clearer headed" off opiates . At this time he is not endorsing severe/significant opiate WDL symptoms. Denies medication side effects at present. Objective:  I have discussed case with treatment team, have met with patient. As noted, patient reports partial improvement, with overall improved mood and range of affect. Has history of chronic pain, but at this time not in any acute distress or restless. As noted, has stated that medications such as neurontin, lyrica, actually increase pain. Thus far he has tolerated Elavil well. Denies any side effects, and is not endorsing any anticholinergic side effects. No disruptive behaviors on unit , going to groups, visible in milieu.  Mood seems partially improved compared to admission- No SI at this time, future oriented, looking forward to see his grandchild soon. Ambivalent about going to rehab , but seems to be more interested in going back home after discharge at this time. States " I am going to try to stay off pain pills [ referring to narcotic medications]" , but states " I am going to try but if the pain doesn't give , I think I am going to have to go back on them at some point ".  Responsive to support, encouragement to continue working on recovery efforts .  Principal Problem: Substance induced mood disorder Diagnosis:   Patient Active Problem List   Diagnosis Date Noted  . Substance induced mood disorder [F19.94] 02/28/2015   Total Time spent with patient: 25 minutes    Past Medical History: History reviewed. No pertinent past medical history.  Past Surgical  History  Procedure Laterality Date  . Back surgery     Family History: History reviewed. No pertinent family history. Social History:  History  Alcohol Use No     History  Drug Use  . Yes    History   Social History  . Marital Status: Married    Spouse Name: N/A  . Number of Children: N/A  . Years of Education: N/A   Social History Main Topics  . Smoking status: Heavy Tobacco Smoker -- 4.00 packs/day    Types: Cigarettes  . Smokeless tobacco: Not on file  . Alcohol Use: No  . Drug Use: Yes  . Sexual Activity: No   Other Topics Concern  . None   Social History Narrative   Additional History:    Sleep: Fair  Appetite:  Fair   Assessment:   Musculoskeletal: Strength & Muscle Tone: within normal limits- no distal tremors . Gait & Station: normal Patient leans: N/A   Psychiatric Specialty Exam: Physical Exam  ROS-  At this time GI upset ( nausea, diarrhea) improved, chronic pain .   Blood pressure 141/78, pulse 114, temperature 97.6 F (36.4 C), temperature source Oral, resp. rate 18, height $RemoveBe'5\' 9"'IHGZIdMJb$  (1.753 m), weight 263 lb (119.296 kg).Body mass index is 38.82 kg/(m^2).  General Appearance:  Improved grooming  Eye Contact::   Good   Speech:  Normal Rate  Volume:  Normal  Mood:  Less depressed   Affect:  Congruent- fuller in range   Thought Process:  Goal  Directed and Linear  Orientation:  Other:  fully alert and attentive, no confusion or delirium  Thought Content:  no hallucinations,no delusions  Suicidal Thoughts:  No denies any SI or self injurious ideations  Homicidal Thoughts:  No  Memory:  recent and remote grossly intact   Judgement:  Fair  Insight:  Present  Psychomotor Activity:  Normal-  Concentration:  Good  Recall:  Good  Fund of Knowledge:Good  Language: Good  Akathisia:  Negative  Handed:  Right  AIMS (if indicated):     Assets:  Desire for Improvement Resilience  ADL's:  Fair   Cognition: WNL  Sleep:  Number of Hours: 6.25      Current Medications: Current Facility-Administered Medications  Medication Dose Route Frequency Provider Last Rate Last Dose  . acetaminophen (TYLENOL) tablet 650 mg  650 mg Oral Q6H PRN Laverle Hobby, PA-C   650 mg at 03/03/15 1456  . alum & mag hydroxide-simeth (MAALOX/MYLANTA) 200-200-20 MG/5ML suspension 30 mL  30 mL Oral Q4H PRN Laverle Hobby, PA-C      . amitriptyline (ELAVIL) tablet 75 mg  75 mg Oral QHS Kerrie Buffalo, NP   75 mg at 03/04/15 2122  . [START ON 03/06/2015] cloNIDine (CATAPRES) tablet 0.1 mg  0.1 mg Oral QAC breakfast Laverle Hobby, PA-C      . diclofenac sodium (VOLTAREN) 1 % transdermal gel 2 g  2 g Topical QID Kerrie Buffalo, NP   2 g at 03/05/15 1129  . dicyclomine (BENTYL) tablet 20 mg  20 mg Oral Q6H PRN Laverle Hobby, PA-C      . hydrOXYzine (ATARAX/VISTARIL) tablet 50 mg  50 mg Oral Q6H PRN Laverle Hobby, PA-C   50 mg at 03/05/15 1131  . lidocaine (LIDODERM) 5 % 1 patch  1 patch Transdermal Daily Jenne Campus, MD   1 patch at 03/03/15 [phone]  . loperamide (IMODIUM) capsule 2-4 mg  2-4 mg Oral PRN Laverle Hobby, PA-C   4 mg at 03/04/15 7591  . magnesium hydroxide (MILK OF MAGNESIA) suspension 30 mL  30 mL Oral Daily PRN Laverle Hobby, PA-C      . methocarbamol (ROBAXIN) tablet 500 mg  500 mg Oral Q8H PRN Laverle Hobby, PA-C   500 mg at 03/04/15 0839  . nicotine (NICODERM CQ - dosed in mg/24 hours) patch 21 mg  21 mg Transdermal Daily Jenne Campus, MD   21 mg at 03/05/15 0758  . ondansetron (ZOFRAN-ODT) disintegrating tablet 4 mg  4 mg Oral Q6H PRN Laverle Hobby, PA-C      . traMADol Veatrice Bourbon) tablet 50 mg  50 mg Oral Q6H PRN Kerrie Buffalo, NP   50 mg at 03/05/15 0802  . traZODone (DESYREL) tablet 50 mg  50 mg Oral QHS PRN Jenne Campus, MD   50 mg at 03/04/15 2125    Lab Results:  No results found for this or any previous visit (from the past 106 hour(s)).  Physical Findings: AIMS: Facial and Oral Movements Muscles of Facial  Expression: None, normal Lips and Perioral Area: None, normal Jaw: None, normal Tongue: None, normal,Extremity Movements Upper (arms, wrists, hands, fingers): None, normal Lower (legs, knees, ankles, toes): None, normal, Trunk Movements Neck, shoulders, hips: None, normal, Overall Severity Severity of abnormal movements (highest score from questions above): None, normal Incapacitation due to abnormal movements: None, normal Patient's awareness of abnormal movements (rate only patient's report): No Awareness, Dental Status Current problems with teeth  and/or dentures?: No Does patient usually wear dentures?: No  CIWA:  CIWA-Ar Total: 1 COWS:  COWS Total Score: 3   Assessment- at present patient improved compared to admission- mood and affect are improved. He is now off opiates and currently opiate WDL symptoms have subsided/resolved. He has chronic pain, but reports that for now he feels he can manage it with non narcotic strategies. Starting to focus on  Disposition planning, discharge . Has tolerated Elavil trial well thus far .  Treatment Plan Summary: Daily contact with patient to assess and evaluate symptoms and progress in treatment, Medication management, Plan inpatient admission and medications as below  Continue Lidoderm patch to affected back area to address  Chronic pain pain. Continue  Elavil  Now at 75 mgrs QHS to address depression, chronic pain, and  Insomnia  Completing Clonidine detox taper to minimize opiate WDL symptoms. Would D/C Ultram due to risk for drug-drug interactions , possible serotonin syndrome .  On Vistaril PRNs for Anxiety /Agitation if needed . Continue Nicoderm patch to minimize nicotine WDL/cravings  Consider discharge soon as he continues  To improve - encourage ongoing abstinence, relapse prevention efforts .  Medical Decision Making:  Established Problem, Stable/Improving (1), Review of Psycho-Social Stressors (1), Review or order clinical lab tests  (1) and Review of Medication Regimen & Side Effects (2)     COBOS, FERNANDO 03/05/2015, 1:47 PM

## 2015-03-05 NOTE — Progress Notes (Signed)
Recreation Therapy Notes  Date: 07.25.16 Time: 9:30 am Location: [address] Group Room  Group Topic: Stress Management  Goal Area(s) Addresses:  Patient will verbalize importance of using healthy stress management.  Patient will identify positive emotions associated with healthy stress management.   Intervention: Stress Management  Activity :  Progressive Muscle Relaxation.  LRT introduced and educated patients on stress management technique of progressive muscle relaxation.  A script was used to deliver the technique to patients.  Patients were asked to follow script read allowed by LRT to engage in practicing the stress management technique.  Education:  Stress Management, Discharge Planning.   Education Outcome: Acknowledges edcuation/In group clarification offered/Needs additional education  Clinical Observations/Feedback: Patient did not attend group.   Juaquina Machnik, LRT/CTRS         Echo Propp A 03/05/2015 1:22 PM 

## 2015-03-05 NOTE — [facility] Suicide Risk Assessment (Signed)
[facility] INPATIENT:  Family/Significant Other Suicide Prevention Education  Suicide Prevention Education:  Education Completed; Renea Stum [phone], wife  (name of family member/significant other) has been identified by the patient as the family member/significant other with whom the patient will be residing, and identified as the person(s) who will aid the patient in the event of a mental health crisis (suicidal ideations/suicide attempt).  With written consent from the patient, the family member/significant other has been provided the following suicide prevention education, prior to the and/or following the discharge of the patient.  The suicide prevention education provided includes the following:  Suicide risk factors  Suicide prevention and interventions  National Suicide Hotline telephone number  [facility] assessment telephone number  [facility] Emergency Assistance 911  [facility] and/or Residential Mobile Crisis Unit telephone number  Request made of family/significant other to:  Remove weapons (e.g., guns, rifles, knives), all items previously/currently identified as safety concern.    Remove drugs/medications (over-the-counter, prescriptions, illicit drugs), all items previously/currently identified as a safety concern.  The family member/significant other verbalizes understanding of the suicide prevention education information provided.  The family member/significant other agrees to remove the items of safety concern listed above.  Wife states that she has removed all firearms, weapons and unneeded medications from the home, stated understanding of suicide prevention education.    Sallee Lange  03/05/2015 5:21 PM  03/05/2015, 5:05 PM

## 2015-03-05 NOTE — Care Management Utilization Note (Signed)
   Per State Regulation 482.30  This chart was reviewed for necessity with respect to the patient's Admission/ Duration of stay.  Next review date:  03/07/15  Nicolasa Ducking RN, BSN

## 2015-03-06 MED ORDER — TRAZODONE HCL 50 MG PO TABS: 50.0000 mg | ORAL_TABLET | Freq: Every evening | ORAL | Status: DC | PRN

## 2015-03-06 MED ORDER — NICOTINE 21 MG/24HR TD PT24: 21.0000 mg | MEDICATED_PATCH | Freq: Every day | TRANSDERMAL | Status: DC

## 2015-03-06 MED ORDER — LIDOCAINE 5 % EX PTCH: 1.0000 | MEDICATED_PATCH | Freq: Every day | CUTANEOUS | Status: DC

## 2015-03-06 MED ORDER — AMITRIPTYLINE HCL 100 MG PO TABS: 100.0000 mg | ORAL_TABLET | Freq: Every day | ORAL | Status: DC

## 2015-03-06 MED ORDER — AMITRIPTYLINE HCL 100 MG PO TABS
100.0000 mg | ORAL_TABLET | Freq: Every day | ORAL | Status: DC
  Filled 2015-03-06: qty 1

## 2015-03-06 NOTE — Progress Notes (Signed)
D:  Patient's self inventory sheet, patient has poor sleep, sleep medication is not helpful.  Fair appetite, normal energy level, good concentration.  Rated depression 3, hopeless 1, anxiety 4.  Denied withdrawals.  Denied SI.  Physical problems, back/feet pain, worst pain #9 in past 24 hours.  Goal is to discharge. A:  Medications administered per MD orders.  Emotional support and encouragement given patient. R:  Patient does refuse lidocaine patch for back, stated patch does not help him.  Safety maintained with 15 minute checks.  Denied SI and HI, contracts for safety.  Denied A/V hallucinations.

## 2015-03-06 NOTE — [facility] Group Notes (Signed)
[facility] Group Notes:  (Nursing/MHT/Case Management/Adjunct)  Date:  03/06/2015  Time:  0900  Type of Therapy:  Psychoeducational Skills  Participation Level:  Did Not Attend  Participation Quality:    Affect:    Cognitive:    Insight:    Engagement in Group:    Modes of Intervention:    Summary of Progress/Problems:  Earline Mayotte 03/06/2015, 9:46 AM
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
## 2015-03-06 NOTE — Progress Notes (Signed)
  [facility] Adult Case Management Discharge Plan :  Will you be returning to the same living situation after discharge:  Yes,  Pt will return home with wife At discharge, do you have transportation home?: Yes,  Pt's wife to provide Do you have the ability to pay for your medications: Yes,  provided with samples and prescriptions  Release of information consent forms completed and in the chart;  Patient's signature needed at discharge.  Patient to Follow up at: Follow-up Information    Follow up with [facility] On 03/13/2015.   Why:  at 3:00pm for your initial appointment. Please arrive 30 minutes early and bring a list of your medications and a copy of your insurance card.   Contact information:   [address]  [place], [place] [address] [phone]      Patient denies SI/HI: Yes,  Pt denies    Safety Planning and Suicide Prevention discussed: Yes,  with wife. See SPE note for further details  Have you used any form of tobacco in the last 30 days? (Cigarettes, Smokeless Tobacco, Cigars, and/or Pipes): Yes  Has patient been referred to the Quitline?: Patient refused referral  Elaina Hoops 03/06/2015, 4:37 PM

## 2015-03-06 NOTE — Progress Notes (Signed)
Discharge Note:  Patient discharged home with wife.  Patient denied SI and HI.  Denied A/V hallucinations.  Suicide prevention information given and reviewed with patient who stated he understood and had no questions.  Patient stated he received all his belongings, clothing, belt, shoe strings, shaving cream, qtips, toiletries, misc items, prescriptions, medications.  Patient stated he appreciated all assistance from [facility] staff.

## 2015-03-06 NOTE — [facility] Suicide Risk Assessment (Signed)
[facility] Discharge Suicide Risk Assessment   Demographic Factors:  49 year old married male   Total Time spent with patient: 30 minutes  Musculoskeletal: Strength & Muscle Tone: within normal limits Gait & Station: normal Patient leans: N/A  Psychiatric Specialty Exam: Physical Exam  ROS  Blood pressure 122/70, pulse 73, temperature 98 F (36.7 C), temperature source Oral, resp. rate 18, height 5\' 9"  (1.753 m), weight 263 lb (119.296 kg).Body mass index is 38.82 kg/(m^2).  General Appearance: improved grooming  Eye Contact::  Good  Speech:  Normal Rate409  Volume:  Normal  Mood:  improved, states he feels better, denies depression at this time  Affect:  reactive, appropriate   Thought Process:  Linear  Orientation:  Full (Time, Place, and Person)  Thought Content:  no hallucinations, no delusions  Suicidal Thoughts:  No  Homicidal Thoughts:  No  Memory:  recent and remote grossly intact   Judgement:  Other:  improved   Insight:  Present  Psychomotor Activity:  Normal  Concentration:  Good  Recall:  Good  Fund of Knowledge:Good  Language: Good  Akathisia:  Negative  Handed:  Right  AIMS (if indicated):     Assets:  Communication Skills Desire for Improvement Resilience Social Support  Sleep:  Number of Hours: 6.25  Cognition: WNL  ADL's: improved    Have you used any form of tobacco in the last 30 days? (Cigarettes, Smokeless Tobacco, Cigars, and/or Pipes): Yes  Has this patient used any form of tobacco in the last 30 days? (Cigarettes, Smokeless Tobacco, Cigars, and/or Pipes) Yes, A prescription for an FDA-approved tobacco cessation medication was offered at discharge and the patient refused  Mental Status Per Nursing Assessment::   On Admission:  NA  Current Mental Status by Physician: At present patient is improved - mood is improved and he states he feels better " than I have for some time now ", affect is more reactive, at this time seems euthymic, no thought  disorder, no SI, no Hi, no psychotic symptoms, future oriented, looking forward to going home and reuniting with family, seeing his grandchildren.  Loss Factors: Chronic pain , financial difficulties   Historical Factors: Depression, Chronic Pain, Opiate Dependence  Risk Reduction Factors:   Sense of responsibility to family, Living with another person, especially a relative, Positive social support and Positive coping skills or problem solving skills  Continued Clinical Symptoms:  As noted, patient currently much improved - no residual opiate WDL symptoms- currently euthymic. Patient states that he is pleased about how he is feeling off narcotics- states he feels " clearer headed", and in a better mood overall. States pain is chronic and significant but " I am going to stay off narcotics , because I realize they were making me feel bad " Elavil increased to 100 mgrs QHS  Cognitive Features That Contribute To Risk:  No gross cognitive deficits noted upon discharge. Is alert , attentive, and oriented x 3   Suicide Risk:  Mild:  Suicidal ideation of limited frequency, intensity, duration, and specificity.  There are no identifiable plans, no associated intent, mild dysphoria and related symptoms, good self-control (both objective and subjective assessment), few other risk factors, and identifiable protective factors, including available and accessible social support.  Principal Problem: Substance induced mood disorder Discharge Diagnoses:  Patient Active Problem List   Diagnosis Date Noted  . Substance induced mood disorder [F19.94] 02/28/2015    Follow-up Information    Follow up with [facility] On 03/13/2015.   Why:  at 3:00pm for your initial appointment. Please arrive 30 minutes early and bring a list of your medications and a copy of your insurance card.   Contact information:   [address]  [place], [place] [address] [phone]      Plan Of  Care/Follow-up recommendations:  Activity:  as tolerated  Diet:  Regular Tests:  NA Other:  See below  Is patient on multiple antipsychotic therapies at discharge:  No   Has Patient had three or more failed trials of antipsychotic monotherapy by history:  No  Recommended Plan for Multiple Antipsychotic Therapies: NA  At this time patient is leaving in good spirits. Plans to return home.Wife is picking him up later today.  Offered by CSW  A referral to pain clinic and to outpatient psychiatrist  but declined- states he plans to follow up with his PCP for pain management and ongoing medication management. We reviewed Elavil related side effects- thus far tolerating medication well.     Cambridge Deleo 03/06/2015, 12:46 PM
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
## 2016-06-03 ENCOUNTER — Ambulatory Visit (INDEPENDENT_AMBULATORY_CARE_PROVIDER_SITE_OTHER): Payer: Medicare Other | Admitting: Urology

## 2016-06-03 ENCOUNTER — Other Ambulatory Visit (HOSPITAL_COMMUNITY)
Admission: RE | Admit: 2016-06-03 | Discharge: 2016-06-03 | Disposition: A | Discharge status: Home / Self Care | Payer: Medicare Other | Source: Ambulatory Visit | Attending: Urology | Admitting: Urology

## 2016-06-03 DIAGNOSIS — R311 Benign essential microscopic hematuria: Secondary | ICD-10-CM

## 2016-06-03 DIAGNOSIS — N401 Enlarged prostate with lower urinary tract symptoms: Secondary | ICD-10-CM | POA: Diagnosis not present

## 2016-06-03 DIAGNOSIS — N5201 Erectile dysfunction due to arterial insufficiency: Secondary | ICD-10-CM

## 2016-06-03 LAB — URINALYSIS W MICROSCOPIC (NOT AT ARMC)
Bilirubin Urine: NEGATIVE
Glucose, UA: NEGATIVE mg/dL
Ketones, ur: NEGATIVE mg/dL
Leukocytes, UA: NEGATIVE
Nitrite: NEGATIVE
Protein, ur: NEGATIVE mg/dL
Specific Gravity, Urine: <1.005 — ABNORMAL LOW (ref 1.005–1.030)
pH: 6 (ref 5.0–8.0)

## 2016-07-15 ENCOUNTER — Ambulatory Visit: Payer: Self-pay | Admitting: Urology

## 2016-08-12 ENCOUNTER — Ambulatory Visit (INDEPENDENT_AMBULATORY_CARE_PROVIDER_SITE_OTHER): Payer: Medicare Other | Admitting: Urology

## 2016-08-12 DIAGNOSIS — N401 Enlarged prostate with lower urinary tract symptoms: Secondary | ICD-10-CM | POA: Diagnosis not present

## 2017-08-27 ENCOUNTER — Ambulatory Visit (INDEPENDENT_AMBULATORY_CARE_PROVIDER_SITE_OTHER): Payer: Medicare Other | Admitting: Otolaryngology

## 2017-08-27 DIAGNOSIS — J320 Chronic maxillary sinusitis: Secondary | ICD-10-CM

## 2017-08-28 ENCOUNTER — Other Ambulatory Visit (INDEPENDENT_AMBULATORY_CARE_PROVIDER_SITE_OTHER): Payer: Self-pay | Admitting: Otolaryngology

## 2017-08-28 DIAGNOSIS — J329 Chronic sinusitis, unspecified: Secondary | ICD-10-CM

## 2017-10-01 ENCOUNTER — Ambulatory Visit (INDEPENDENT_AMBULATORY_CARE_PROVIDER_SITE_OTHER): Payer: Self-pay | Admitting: Otolaryngology

## 2017-10-01 ENCOUNTER — Ambulatory Visit (HOSPITAL_COMMUNITY): Payer: Medicare Other

## 2019-08-30 ENCOUNTER — Other Ambulatory Visit: Payer: Self-pay

## 2019-08-30 ENCOUNTER — Encounter (HOSPITAL_COMMUNITY): Payer: Self-pay | Admitting: Emergency Medicine

## 2019-08-30 ENCOUNTER — Emergency Department (HOSPITAL_COMMUNITY): Payer: Medicare Other

## 2019-08-30 ENCOUNTER — Emergency Department (HOSPITAL_COMMUNITY)
Admission: EM | Admit: 2019-08-30 | Discharge: 2019-08-30 | Disposition: A | Discharge status: Home / Self Care | Payer: Medicare Other | Attending: Emergency Medicine | Admitting: Emergency Medicine

## 2019-08-30 DIAGNOSIS — R109 Unspecified abdominal pain: Secondary | ICD-10-CM | POA: Diagnosis present

## 2019-08-30 DIAGNOSIS — F17210 Nicotine dependence, cigarettes, uncomplicated: Secondary | ICD-10-CM | POA: Diagnosis not present

## 2019-08-30 DIAGNOSIS — Z79899 Other long term (current) drug therapy: Secondary | ICD-10-CM | POA: Diagnosis not present

## 2019-08-30 HISTORY — DX: Other psychoactive substance use, unspecified, uncomplicated: F19.90

## 2019-08-30 LAB — URINALYSIS, ROUTINE W REFLEX MICROSCOPIC
Bacteria, UA: NONE SEEN
Bilirubin Urine: NEGATIVE
Glucose, UA: NEGATIVE mg/dL
Ketones, ur: NEGATIVE mg/dL
Leukocytes,Ua: NEGATIVE
Nitrite: NEGATIVE
Protein, ur: NEGATIVE mg/dL
Specific Gravity, Urine: 1.003 — ABNORMAL LOW (ref 1.005–1.030)
pH: 6 (ref 5.0–8.0)

## 2019-08-30 LAB — CBC WITH DIFFERENTIAL/PLATELET
Abs Immature Granulocytes: 0.06 10*3/uL (ref 0.00–0.07)
Basophils Absolute: 0.1 10*3/uL (ref 0.0–0.1)
Basophils Relative: 1 %
Eosinophils Absolute: 0.1 10*3/uL (ref 0.0–0.5)
Eosinophils Relative: 1 %
HCT: 49.2 % (ref 39.0–52.0)
Hemoglobin: 16.2 g/dL (ref 13.0–17.0)
Immature Granulocytes: 0 %
Lymphocytes Relative: 20 %
Lymphs Abs: 2.9 10*3/uL (ref 0.7–4.0)
MCH: 30.3 pg (ref 26.0–34.0)
MCHC: 32.9 g/dL (ref 30.0–36.0)
MCV: 92 fL (ref 80.0–100.0)
Monocytes Absolute: 0.9 10*3/uL (ref 0.1–1.0)
Monocytes Relative: 7 %
Neutro Abs: 10 10*3/uL — ABNORMAL HIGH (ref 1.7–7.7)
Neutrophils Relative %: 71 %
Platelets: 366 10*3/uL (ref 150–400)
RBC: 5.35 MIL/uL (ref 4.22–5.81)
RDW: 13.7 % (ref 11.5–15.5)
WBC: 14.1 10*3/uL — ABNORMAL HIGH (ref 4.0–10.5)
nRBC: 0 % (ref 0.0–0.2)

## 2019-08-30 LAB — COMPREHENSIVE METABOLIC PANEL
ALT: 18 U/L (ref 0–44)
AST: 20 U/L (ref 15–41)
Albumin: 3.9 g/dL (ref 3.5–5.0)
Alkaline Phosphatase: 163 U/L — ABNORMAL HIGH (ref 38–126)
Anion gap: 12 (ref 5–15)
BUN: 11 mg/dL (ref 6–20)
CO2: 22 mmol/L (ref 22–32)
Calcium: 9.1 mg/dL (ref 8.9–10.3)
Chloride: 99 mmol/L (ref 98–111)
Creatinine, Ser: 0.96 mg/dL (ref 0.61–1.24)
GFR calc Af Amer: >60 mL/min (ref >60)
GFR calc non Af Amer: >60 mL/min (ref >60)
Glucose, Bld: 111 mg/dL — ABNORMAL HIGH (ref 70–99)
Potassium: 3.3 mmol/L — ABNORMAL LOW (ref 3.5–5.1)
Sodium: 133 mmol/L — ABNORMAL LOW (ref 135–145)
Total Bilirubin: 0.7 mg/dL (ref 0.3–1.2)
Total Protein: 9.2 g/dL — ABNORMAL HIGH (ref 6.5–8.1)

## 2019-08-30 LAB — CK: Total CK: 66 U/L (ref 49–397)

## 2019-08-30 LAB — LIPASE, BLOOD: Lipase: 39 U/L (ref 11–51)

## 2019-08-30 MED ORDER — SODIUM CHLORIDE 0.9 % IV BOLUS
1000.0000 mL | Freq: Once | INTRAVENOUS | Status: AC
  Administered 2019-08-30: 1000 mL via INTRAVENOUS

## 2019-08-30 MED ORDER — HYDROMORPHONE HCL 1 MG/ML IJ SOLN
1.0000 mg | Freq: Once | INTRAMUSCULAR | Status: AC
  Administered 2019-08-30: 1 mg via INTRAVENOUS
  Filled 2019-08-30: qty 1

## 2019-08-30 MED ORDER — POTASSIUM CHLORIDE CRYS ER 20 MEQ PO TBCR
40.0000 meq | EXTENDED_RELEASE_TABLET | Freq: Once | ORAL | Status: AC
  Administered 2019-08-30: 13:00:00 40 meq via ORAL
  Filled 2019-08-30: qty 2

## 2019-08-30 MED ORDER — METHOCARBAMOL 500 MG PO TABS: 500.0000 mg | ORAL_TABLET | Freq: Three times a day (TID) | ORAL | 0 refills | Status: DC | PRN

## 2019-08-30 NOTE — ED Provider Notes (Signed)
[facility] EMERGENCY DEPARTMENT Provider Note   CSN: [id number] Arrival date & time: 08/30/19  1008     History Chief Complaint  Patient presents with  . Flank Pain    Jerome Camacho is a 54 y.o. male.  HPI 54 year old male presents with right flank pain.  Is constant and severe.  Started about 3 days ago.  He states that nothing has made it better.  He is chronically on oxycodone and has been shooting up multiple meds over the last 6 months.  This includes morphine and heroin though he has not done that in several days.  Last set up oxycodone this morning but it is not helping.  He denies a fever.  No vomiting.  No abdominal pain or radiation of the pain.  No urinary incontinence, dysuria, hematuria.  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  He states he has had blood in his urine for many years when analyzed in the urinalysis.  Past Medical History:  Diagnosis Date  . IV drug user     Patient Active Problem List   Diagnosis Date Noted  . Substance induced mood disorder ([place]) 02/28/2015    Past Surgical History:  Procedure Laterality Date  . APPENDECTOMY    . BACK SURGERY         History reviewed. No pertinent family history.  Social History   Tobacco Use  . Smoking status: Heavy Tobacco Smoker    Packs/day: 4.00    Types: Cigarettes  . Smokeless tobacco: Never Used  Substance Use Topics  . Alcohol use: No  . Drug use: Yes    Types: IV    Comment: oxycodone    Home Medications Prior to Admission medications   Medication Sig Start Date End Date Taking? Authorizing Provider  amitriptyline (ELAVIL) 100 MG tablet Take 1 tablet (100 mg total) by mouth at bedtime. 03/06/15   Niel Hummer, NP  lidocaine (LIDODERM) 5 % Place 1 patch onto the skin daily. Remove & Discard patch within 12 hours or as directed by MD 03/06/15   Niel Hummer, NP  methocarbamol (ROBAXIN) 500 MG tablet Take 1 tablet (500 mg total) by mouth every 8  (eight) hours as needed for muscle spasms. 08/30/19   Sherwood Gambler, MD  nicotine (NICODERM CQ - DOSED IN MG/24 HOURS) 21 mg/24hr patch Place 1 patch (21 mg total) onto the skin daily. 03/06/15   Niel Hummer, NP  traZODone (DESYREL) 50 MG tablet Take 1 tablet (50 mg total) by mouth at bedtime as needed for sleep. 03/06/15   Niel Hummer, NP    Allergies    Nsaids and Cymbalta [duloxetine hcl]  Review of Systems   Review of Systems  Constitutional: Negative for fever.  Respiratory: Negative for shortness of breath.   Cardiovascular: Negative for chest pain.  Gastrointestinal: Negative for abdominal pain and vomiting.  Genitourinary: Positive for flank pain. Negative for dysuria and hematuria.       No B/B incontinence  Musculoskeletal: Positive for back pain.  All other systems reviewed and are negative.   Physical Exam Updated Vital Signs BP 128/78   Pulse 99 Comment: Simultaneous filing. User may not have seen previous data.  Temp 97.8 F (36.6 C) (Oral)   Resp 11   Ht 5\' 10"  (1.778 m)   Wt 108.9 kg   SpO2 98% Comment: Simultaneous filing. User may not have seen previous data.  BMI 34.44 kg/m  Physical Exam Vitals and nursing note reviewed.  Constitutional:      General: He is in acute distress (in pain).     Appearance: He is well-developed.  HENT:     Head: Normocephalic and atraumatic.     Right Ear: External ear normal.     Left Ear: External ear normal.     Nose: Nose normal.  Eyes:     General:        Right eye: No discharge.        Left eye: No discharge.  Cardiovascular:     Rate and Rhythm: Regular rhythm. Tachycardia present.     Heart sounds: Normal heart sounds.  Pulmonary:     Effort: Pulmonary effort is normal.     Breath sounds: Normal breath sounds.  Abdominal:     General: There is no distension.     Palpations: Abdomen is soft.     Tenderness: There is no abdominal tenderness.  Musculoskeletal:     Cervical back: Neck supple.      Thoracic back: Tenderness present.       Back:     Comments: TTP over right flank. Mild over left CVA. No rash  Skin:    General: Skin is warm and dry.  Neurological:     Mental Status: He is alert.  Psychiatric:        Mood and Affect: Mood is not anxious.     ED Results / Procedures / Treatments   Labs (all labs ordered are listed, but only abnormal results are displayed) Labs Reviewed  URINALYSIS, ROUTINE W REFLEX MICROSCOPIC - Abnormal; Notable for the following components:      Result Value   Specific Gravity, Urine 1.003 (*)    Hgb urine dipstick MODERATE (*)    All other components within normal limits  COMPREHENSIVE METABOLIC PANEL - Abnormal; Notable for the following components:   Sodium 133 (*)    Potassium 3.3 (*)    Glucose, Bld 111 (*)    Total Protein 9.2 (*)    Alkaline Phosphatase 163 (*)    All other components within normal limits  CBC WITH DIFFERENTIAL/PLATELET - Abnormal; Notable for the following components:   WBC 14.1 (*)    Neutro Abs 10.0 (*)    All other components within normal limits  LIPASE, BLOOD  CK    EKG None  Radiology CT Renal Stone Study  Result Date: 08/30/2019 CLINICAL DATA:  Worsening right flank pain over the past 3 days. EXAM: CT ABDOMEN AND PELVIS WITHOUT CONTRAST TECHNIQUE: Multidetector CT imaging of the abdomen and pelvis was performed following the standard protocol without IV contrast. COMPARISON:  None. FINDINGS: Lower chest: Emphysematous disease is seen in the lung bases. No pleural or pericardial effusion. Hepatobiliary: Low attenuating lesions in the posterior right hepatic lobe on image 14 measures 1.3 cm and a second low attenuating lesion in the right hepatic lobe on image 29 measures 1 cm. These are likely cysts. The liver is otherwise unremarkable. Gallbladder and biliary tree appear normal. Pancreas: The pancreas is somewhat atrophic. No pancreatic ductal dilatation or surrounding inflammatory changes. Spleen: Normal  in size without focal abnormality. Adrenals/Urinary Tract: Adrenal glands appear normal. The kidneys and ureters are normal without stone or hydronephrosis. Walls of the urinary bladder are thickened but the bladder is almost completely decompressed. Stomach/Bowel: Stomach is within normal limits. Status post appendectomy. No evidence of bowel wall thickening, distention, or inflammatory changes. Vascular/Lymphatic: Aortic atherosclerosis. No enlarged abdominal or  pelvic lymph nodes. Reproductive: Prostate is unremarkable. Other: None. Musculoskeletal: No acute or focal abnormality. The patient is status post L5-S1 fusion. IMPRESSION: Walls of the urinary bladder are thickened but the bladder is almost completely decompressed. Negative for hydronephrosis or ureteral stone. Emphysema. Aortic atherosclerosis. Electronically Signed   By: Drusilla Kanner M.D.   On: 08/30/2019 11:54    Procedures Procedures (including critical care time)  Medications Ordered in ED Medications  sodium chloride 0.9 % bolus 1,000 mL (0 mLs Intravenous Stopped 08/30/19 1249)  HYDROmorphone (DILAUDID) injection 1 mg (1 mg Intravenous Given 08/30/19 1148)  potassium chloride SA (KLOR-CON) CR tablet 40 mEq (40 mEq Oral Given 08/30/19 1256)    ED Course  I have reviewed the triage vital signs and the nursing notes.  Pertinent labs & imaging results that were available during my care of the patient were reviewed by me and considered in my medical decision making (see chart for details).    MDM Rules/Calculators/A&P                      Patient's work-up is pretty unremarkable.  He has hematuria though he remarks he has been having this for a long time.  CK is normal.  Mild hypokalemia but this will be repleted orally.  White count is up but it is unclear why.  At this point, with a essentially negative CT stone study, his vital signs having normalized, I see no reason for further emergency department work-up.  No neuro  signs/symptoms that would be concerning for spinal cord emergency.  Follow-up with PCP.  Will prescribe Robaxin to see if this helps with what is probably muscular pain, especially given how painful it is with certain movements. Final Clinical Impression(s) / ED Diagnoses Final diagnoses:  Right flank pain    Rx / DC Orders ED Discharge Orders         Ordered    methocarbamol (ROBAXIN) 500 MG tablet  Every 8 hours PRN     08/30/19 1235           Pricilla Loveless, MD 08/30/19 1318

## 2019-08-30 NOTE — ED Triage Notes (Addendum)
PT c/o right sided flank pain worsening over the past 3 days. PT denies any urinary symptoms. PT states he has relapsed with drug use recently and he injected 10mg  of oxycodone at 0900 and took a 5mg  valium today by mouth. PT states he is prescribed oxycodone 10 mg four times a day by his primary doctor and trying to get into pain management.

## 2019-08-30 NOTE — Discharge Instructions (Addendum)
If you develop worsening, recurrent, or continued back pain, numbness or weakness in the legs, incontinence of your bowels or bladders, numbness of your buttocks, fever, abdominal pain, or any other new/concerning symptoms then return to the ER for evaluation.  

## 2020-02-24 ENCOUNTER — Emergency Department (HOSPITAL_COMMUNITY): Payer: Medicare Other

## 2020-02-24 ENCOUNTER — Encounter (HOSPITAL_COMMUNITY): Payer: Self-pay | Admitting: Student

## 2020-02-24 ENCOUNTER — Inpatient Hospital Stay (HOSPITAL_COMMUNITY)
Admission: EM | Admit: 2020-02-24 | Discharge: 2020-02-26 | DRG: 313 | Disposition: A | Discharge status: Home / Self Care | Payer: Medicare Other | Attending: Internal Medicine | Admitting: Internal Medicine

## 2020-02-24 ENCOUNTER — Other Ambulatory Visit: Payer: Self-pay

## 2020-02-24 DIAGNOSIS — Z20822 Contact with and (suspected) exposure to covid-19: Secondary | ICD-10-CM | POA: Diagnosis present

## 2020-02-24 DIAGNOSIS — Z79899 Other long term (current) drug therapy: Secondary | ICD-10-CM | POA: Diagnosis not present

## 2020-02-24 DIAGNOSIS — F17210 Nicotine dependence, cigarettes, uncomplicated: Secondary | ICD-10-CM | POA: Diagnosis present

## 2020-02-24 DIAGNOSIS — N400 Enlarged prostate without lower urinary tract symptoms: Secondary | ICD-10-CM | POA: Diagnosis not present

## 2020-02-24 DIAGNOSIS — Z720 Tobacco use: Secondary | ICD-10-CM

## 2020-02-24 DIAGNOSIS — E785 Hyperlipidemia, unspecified: Secondary | ICD-10-CM

## 2020-02-24 DIAGNOSIS — Z886 Allergy status to analgesic agent status: Secondary | ICD-10-CM | POA: Diagnosis not present

## 2020-02-24 DIAGNOSIS — I10 Essential (primary) hypertension: Secondary | ICD-10-CM

## 2020-02-24 DIAGNOSIS — Z8249 Family history of ischemic heart disease and other diseases of the circulatory system: Secondary | ICD-10-CM | POA: Diagnosis not present

## 2020-02-24 DIAGNOSIS — R7989 Other specified abnormal findings of blood chemistry: Secondary | ICD-10-CM

## 2020-02-24 DIAGNOSIS — D72829 Elevated white blood cell count, unspecified: Secondary | ICD-10-CM

## 2020-02-24 DIAGNOSIS — E876 Hypokalemia: Secondary | ICD-10-CM | POA: Diagnosis not present

## 2020-02-24 DIAGNOSIS — E871 Hypo-osmolality and hyponatremia: Secondary | ICD-10-CM

## 2020-02-24 DIAGNOSIS — F1994 Other psychoactive substance use, unspecified with psychoactive substance-induced mood disorder: Secondary | ICD-10-CM | POA: Diagnosis present

## 2020-02-24 DIAGNOSIS — F1910 Other psychoactive substance abuse, uncomplicated: Secondary | ICD-10-CM

## 2020-02-24 DIAGNOSIS — R778 Other specified abnormalities of plasma proteins: Secondary | ICD-10-CM | POA: Diagnosis present

## 2020-02-24 DIAGNOSIS — R079 Chest pain, unspecified: Principal | ICD-10-CM | POA: Diagnosis present

## 2020-02-24 LAB — CBC
HCT: 43.6 % (ref 39.0–52.0)
Hemoglobin: 14.6 g/dL (ref 13.0–17.0)
MCH: 30.5 pg (ref 26.0–34.0)
MCHC: 33.5 g/dL (ref 30.0–36.0)
MCV: 91 fL (ref 80.0–100.0)
Platelets: 238 10*3/uL (ref 150–400)
RBC: 4.79 MIL/uL (ref 4.22–5.81)
RDW: 14 % (ref 11.5–15.5)
WBC: 14.2 10*3/uL — ABNORMAL HIGH (ref 4.0–10.5)
nRBC: 0 % (ref 0.0–0.2)

## 2020-02-24 LAB — RAPID URINE DRUG SCREEN, HOSP PERFORMED
Amphetamines: NOT DETECTED
Barbiturates: NOT DETECTED
Benzodiazepines: NOT DETECTED
Cocaine: NOT DETECTED
Opiates: NOT DETECTED
Tetrahydrocannabinol: POSITIVE — AB

## 2020-02-24 LAB — LIPID PANEL
Cholesterol: 192 mg/dL (ref 0–200)
HDL: 24 mg/dL — ABNORMAL LOW (ref >40)
LDL Cholesterol: 142 mg/dL — ABNORMAL HIGH (ref 0–99)
Total CHOL/HDL Ratio: 8 RATIO
Triglycerides: 130 mg/dL (ref <150)
VLDL: 26 mg/dL (ref 0–40)

## 2020-02-24 LAB — COMPREHENSIVE METABOLIC PANEL
ALT: 19 U/L (ref 0–44)
AST: 25 U/L (ref 15–41)
Albumin: 3.8 g/dL (ref 3.5–5.0)
Alkaline Phosphatase: 182 U/L — ABNORMAL HIGH (ref 38–126)
Anion gap: 13 (ref 5–15)
BUN: 8 mg/dL (ref 6–20)
CO2: 22 mmol/L (ref 22–32)
Calcium: 8.9 mg/dL (ref 8.9–10.3)
Chloride: 97 mmol/L — ABNORMAL LOW (ref 98–111)
Creatinine, Ser: 1.17 mg/dL (ref 0.61–1.24)
GFR calc Af Amer: >60 mL/min (ref >60)
GFR calc non Af Amer: >60 mL/min (ref >60)
Glucose, Bld: 105 mg/dL — ABNORMAL HIGH (ref 70–99)
Potassium: 3.2 mmol/L — ABNORMAL LOW (ref 3.5–5.1)
Sodium: 132 mmol/L — ABNORMAL LOW (ref 135–145)
Total Bilirubin: 0.8 mg/dL (ref 0.3–1.2)
Total Protein: 7.6 g/dL (ref 6.5–8.1)

## 2020-02-24 LAB — TROPONIN I (HIGH SENSITIVITY)
Troponin I (High Sensitivity): 45 ng/L — ABNORMAL HIGH (ref <18)
Troponin I (High Sensitivity): 45 ng/L — ABNORMAL HIGH (ref <18)

## 2020-02-24 LAB — SARS CORONAVIRUS 2 BY RT PCR (HOSPITAL ORDER, PERFORMED IN ~~LOC~~ HOSPITAL LAB): SARS Coronavirus 2: NEGATIVE

## 2020-02-24 LAB — CBG MONITORING, ED: Glucose-Capillary: 104 mg/dL — ABNORMAL HIGH (ref 70–99)

## 2020-02-24 MED ORDER — HEPARIN BOLUS VIA INFUSION
4000.0000 [IU] | Freq: Once | INTRAVENOUS | Status: AC
  Administered 2020-02-24: 4000 [IU] via INTRAVENOUS

## 2020-02-24 MED ORDER — TAMSULOSIN HCL 0.4 MG PO CAPS
0.8000 mg | ORAL_CAPSULE | Freq: Every day | ORAL | Status: DC
  Administered 2020-02-25: 0.8 mg via ORAL
  Filled 2020-02-24: qty 2

## 2020-02-24 MED ORDER — SODIUM CHLORIDE 0.9 % IV BOLUS
1000.0000 mL | Freq: Once | INTRAVENOUS | Status: AC
  Administered 2020-02-24: 1000 mL via INTRAVENOUS

## 2020-02-24 MED ORDER — NITROGLYCERIN 0.4 MG SL SUBL: 0.4000 mg | SUBLINGUAL_TABLET | SUBLINGUAL | Status: DC | PRN

## 2020-02-24 MED ORDER — ACETAMINOPHEN 325 MG PO TABS: 650.0000 mg | ORAL_TABLET | ORAL | Status: DC | PRN

## 2020-02-24 MED ORDER — BUPRENORPHINE HCL-NALOXONE HCL 8-2 MG SL SUBL: 1.0000 | SUBLINGUAL_TABLET | Freq: Every day | SUBLINGUAL | Status: DC

## 2020-02-24 MED ORDER — ASPIRIN EC 81 MG PO TBEC: 81.0000 mg | DELAYED_RELEASE_TABLET | Freq: Once | ORAL | Status: DC

## 2020-02-24 MED ORDER — CLONIDINE HCL 0.1 MG PO TABS
0.1000 mg | ORAL_TABLET | Freq: Three times a day (TID) | ORAL | Status: DC
  Administered 2020-02-24 – 2020-02-26 (×6): 0.1 mg via ORAL
  Filled 2020-02-24 (×6): qty 1

## 2020-02-24 MED ORDER — ASPIRIN 81 MG PO CHEW
243.0000 mg | CHEWABLE_TABLET | Freq: Once | ORAL | Status: AC
  Administered 2020-02-24: 243 mg via ORAL
  Filled 2020-02-24: qty 3

## 2020-02-24 MED ORDER — ONDANSETRON HCL 4 MG/2ML IJ SOLN: 4.0000 mg | Freq: Four times a day (QID) | INTRAMUSCULAR | Status: DC | PRN

## 2020-02-24 MED ORDER — SODIUM CHLORIDE 0.9% FLUSH
3.0000 mL | Freq: Once | INTRAVENOUS | Status: AC
  Administered 2020-02-24: 3 mL via INTRAVENOUS

## 2020-02-24 MED ORDER — HEPARIN (PORCINE) 25000 UT/250ML-% IV SOLN
1500.0000 [IU]/h | INTRAVENOUS | Status: DC
  Administered 2020-02-24: 1300 [IU]/h via INTRAVENOUS
  Administered 2020-02-26: 1500 [IU]/h via INTRAVENOUS
  Filled 2020-02-24 (×3): qty 250

## 2020-02-24 MED ORDER — ATORVASTATIN CALCIUM 80 MG PO TABS
80.0000 mg | ORAL_TABLET | Freq: Every day | ORAL | Status: DC
  Administered 2020-02-25 – 2020-02-26 (×2): 80 mg via ORAL
  Filled 2020-02-24 (×2): qty 1

## 2020-02-24 MED ORDER — POTASSIUM CHLORIDE CRYS ER 20 MEQ PO TBCR
40.0000 meq | EXTENDED_RELEASE_TABLET | Freq: Once | ORAL | Status: AC
  Administered 2020-02-24: 40 meq via ORAL
  Filled 2020-02-24: qty 2

## 2020-02-24 MED ORDER — NICOTINE 21 MG/24HR TD PT24
21.0000 mg | MEDICATED_PATCH | Freq: Every day | TRANSDERMAL | Status: DC
  Administered 2020-02-24 – 2020-02-26 (×3): 21 mg via TRANSDERMAL
  Filled 2020-02-24 (×3): qty 1

## 2020-02-24 MED ORDER — BUPRENORPHINE HCL-NALOXONE HCL 2-0.5 MG SL SUBL
1.0000 | SUBLINGUAL_TABLET | Freq: Four times a day (QID) | SUBLINGUAL | Status: DC
  Administered 2020-02-24 – 2020-02-26 (×7): 1 via SUBLINGUAL
  Filled 2020-02-24 (×7): qty 1

## 2020-02-24 NOTE — H&P (Signed)
History and Physical  Jerome Camacho JJO:[id number] DOB: 1966-03-03 DOA: 02/24/2020  Referring physician: Harvie Heck, PA-C PCP: [facility]  Patient coming from: Home  Chief Complaint: Chest pain  HPI: Jerome Camacho is a 54 y.o. 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  He states that he was working on his mom's Zenaida Niece so he can take her to her doctor's appointment when he suddenly developed a midsternal sharp chest pain with radiation to the jaw with sensation of some acidic taste in his mouth, so he thought this was due to acid reflux, the pain subsided.  On arrival to the doctor's office and while waiting for his mother in patient's lounge at the doctor's office, he had a return of the chest pain which was now more severe and also with radiation to the jaw and associated with diaphoresis, he was given one baby aspirin and one sublingual nitroglycerin at the doctor's office and EMS was activated.  Prior to arrival to the ED, pain severity has decreased from 9/10 to 1/10.  Three baby aspirin was given in the ED.  Patient states that he has never had similar pain before and endorsed that his father died at age of 43 due to MI.  He denies shortness of breath,headache, blurry vision, nausea, vomiting or abdominal pain.    ED Course:  In the emergency department, was mildly tachycardic and tachypneic on arrival, but this improved while in the ED.  Work-up in the ED showed leukocytosis, hyponatremia, hypokalemia, elevated ALP, troponin I x2- 45, SARS coronavirus 2 was negative, urine drug screen was positive for THC.  Chest x-ray shows no acute cardiopulmonary disease.  IV heparin drip was started, IV NS 1 L was given, aspirin 243 mg p.o. was given as well.  Cardiologist at [facility] (Dr. Royann Shivers) was consulted and recommends hospitalist admission to [facility].  Hospitalist was asked to admit patient for further evaluation and management.  Review of Systems: Constitutional: Positive for diaphoresis.  Negative for chills and fever.  HENT: Negative for ear pain and sore throat.   Eyes: Negative for pain and visual disturbance.  Respiratory: Negative for cough, chest tightness and shortness of breath.   Cardiovascular: Positive for chest pain.  Negative for palpitations.  Gastrointestinal: Positive for nausea.  Negative for abdominal pain and vomiting.  Endocrine: Negative for polyphagia and polyuria.  Genitourinary: Negative for decreased urine volume, dysuria, enuresis, hematuria Musculoskeletal: Negative for arthralgias and back pain.  Skin: Negative for color change and rash.  Allergic/Immunologic: Negative for immunocompromised state.  Neurological: Negative for tremors, syncope, speech difficulty, weakness, light-headedness and headaches.  Hematological: Does not bruise/bleed easily.  All other systems reviewed and are negative   Past Medical History:  Diagnosis Date  . IV drug user    Past Surgical History:  Procedure Laterality Date  . APPENDECTOMY    . BACK SURGERY      Social History:  reports that he has been smoking cigarettes. He has been smoking about 4.00 packs per day. He has never used smokeless tobacco. He reports previous drug use. Drug: IV. He reports that he does not drink alcohol.   Allergies  Allergen Reactions  . Nsaids Other (See Comments)    Internal bleeding   . Cymbalta [Duloxetine Hcl] Other (See Comments)  Blood sugar "bottomed out"    History reviewed. No pertinent family history.   Prior to Admission medications   Medication Sig Start Date End Date Taking? Authorizing Provider  aspirin EC 81 MG tablet Take 81 mg by mouth once. Swallow whole.   Yes [provider]  atorvastatin (LIPITOR) 80 MG tablet Take  80 mg by mouth daily. 02/12/20  Yes [provider]  buprenorphine-naloxone (SUBOXONE) 8-2 mg SUBL SL tablet Place 0.5 tablets under the tongue in the morning, at noon, in the evening, and at bedtime. 02/20/20  Yes [provider]  cloNIDine (CATAPRES) 0.1 MG tablet Take 0.1 mg by mouth 3 (three) times daily as needed. 02/14/20  Yes [provider]  cyclobenzaprine (FLEXERIL) 10 MG tablet Take 10 mg by mouth every 8 (eight) hours. 02/19/20  Yes [provider]  escitalopram (LEXAPRO) 20 MG tablet Take 1 tablet by mouth daily. 01/22/20  Yes [provider]  fluticasone (FLONASE) 50 MCG/ACT nasal spray Place 1 spray into both nostrils daily as needed for allergies.  10/14/19  Yes [provider]  gabapentin (NEURONTIN) 300 MG capsule Take 900 mg by mouth 3 (three) times daily.  02/05/20  Yes [provider]  nitroGLYCERIN (NITROSTAT) 0.4 MG SL tablet Place 0.4 mg under the tongue every 5 (five) minutes as needed for chest pain.   Yes [provider]  predniSONE (DELTASONE) 20 MG tablet Take 20 mg by mouth daily as needed.  02/06/20  Yes [provider]  tamsulosin (FLOMAX) 0.4 MG CAPS capsule Take 0.8 mg by mouth daily after supper.  02/14/20  Yes [provider]    Physical Exam: BP 121/90   Pulse 82   Temp 97.6 F (36.4 C) (Oral)   Resp 16   Ht 5\' 9"  (1.753 m)   Wt 115.2 kg   SpO2 97%   BMI 37.51 kg/m   . General: 54 y.o. year-old male well developed well nourished in no acute distress.  Alert and oriented x3. 57 HEENT: NCAT, EOMI, PERRLA . Neck: Supple, trachea medial . Cardiovascular: Tachycardia, regular rate and rhythm with no rubs or gallops.  No thyromegaly or JVD noted.  No lower extremity edema. 2/4 pulses in all 4 extremities. Marland Kitchen Respiratory: Clear to auscultation with no wheezes or rales. Good inspiratory effort. . Abdomen: Soft nontender nondistended with normal bowel sounds x4  quadrants. . Muskuloskeletal: No cyanosis, clubbing or edema noted bilaterally . Neuro: CN II-XII intact, strength, sensation, reflexes . Skin: No ulcerative lesions noted or rashes . Psychiatry: Judgement and insight appear normal. Mood is appropriate for condition and setting          Labs on Admission:  Basic Metabolic Panel: Recent Labs  Lab 02/24/20 1650  NA 132*  K 3.2*  CL 97*  CO2 22  GLUCOSE 105*  BUN 8  CREATININE 1.17  CALCIUM 8.9   Liver Function Tests: Recent Labs  Lab 02/24/20 1650  AST 25  ALT 19  ALKPHOS 182*  BILITOT 0.8  PROT 7.6  ALBUMIN 3.8   No results for input(s): LIPASE, AMYLASE in the last 168 hours. No results for input(s): AMMONIA in the last 168 hours. CBC: Recent Labs  Lab 02/24/20 1646  WBC 14.2*  HGB 14.6  HCT 43.6  MCV 91.0  PLT 238   Cardiac Enzymes: No results for input(s): CKTOTAL, CKMB, CKMBINDEX, TROPONINI in the last 168 hours.  BNP (last 3 results) No results for input(s): BNP in the last 8760 hours.  ProBNP (last 3 results) No results for input(s): PROBNP in the last 8760 hours.  CBG: Recent Labs  Lab 02/24/20 1800  GLUCAP 104*    Radiological Exams on Admission: DG Chest 2 View  Result Date: 02/24/2020 CLINICAL DATA:  Chest pain EXAM: CHEST - 2 VIEW COMPARISON:  None. FINDINGS: The lungs are well expanded and are symmetric. Mild right basilar scarring. The lungs are otherwise clear. No pneumothorax or pleural effusion. Cardiac size within normal limits. The pulmonary vascularity is normal. No acute bone abnormality. IMPRESSION: No active cardiopulmonary disease. Electronically Signed   By: Helyn Numbers MD   On: 02/24/2020 17:40    EKG: I independently viewed the EKG done and my findings are as followed: Sinus tachycardia at rate of 106bpm  Assessment/Plan Present on Admission: . Chest pain . Substance induced mood disorder (HCC)  Principal Problem:   Chest pain Active Problems:   Substance induced  mood disorder (HCC)   Essential hypertension   Hyperlipidemia   BPH (benign prostatic hyperplasia)   Leukocytosis   Hyponatremia   Hypokalemia   Troponin level elevated   Tobacco abuse   Substance abuse (HCC)   Chest pain rule out ACS Elevated troponin Patient presented with chest pain with radiation to the jaw and associated with diaphoresis.  Chest pain was relieved with nitroglycerin. Patient will be admitted to telemetry unit Heart score=5 Troponin x2 = 45 Patient was started on IV heparin drip Continue aspirin 81 mg p.o. daily and nitroglycerin as needed per home regimen Cardiology at [facility] (Dr. Royann Shivers) was consulted by [facility] ED physician and recommended admitting patient to hospitalist team at [facility] and that cardiology team will follow up with the patient. Hospitalist at [facility] will need to notify cardiology team once patient arrives at [facility].   Leukocytosis possibly reactive WBC= 14.2, no acute concern for any infectious process at this time Continue to monitor WBC with morning labs.  Electrolyte abnormalities (hyponatremia, hypokalemia) Na 132, IV hydration provided in the ED, continue to monitor sodium level with morning labs K+ 3.2; this will be replenished  Essential hypertension Continue clonidine per home regimen when med rec is updated  Hyperlipidemia Continue Lipitor per home regimen when med rec is updated  BPH Continue Flomax per home regimen when med rec is updated   Tobacco abuse/substance abuse Patient was counseled on tobacco abuse and substance abuse cessation and he verbalized understanding discussion. Continue nicotine patch  DVT prophylaxis: Lovenox  Code Status: Full code  Family Communication: Wife at bedside (all questions answered to satisfaction)  Disposition Plan:  Patient is from:                        home Anticipated DC to:                   home Anticipated DC date:              24 Hours Anticipated DC  barriers:         Unstable to discharge at this time due to chest pain with diaphoresis requiring nitroglycerin prior to improvement and pending cardiology evaluation and recommendation    Consults called: Cardiologist (Dr. Royann Shivers) per ED physician  Admission status: Observation    Frankey Shown MD [facility]  If 7PM-7AM, please contact night-coverage [email or web address]  02/24/2020, 9:55 PM

## 2020-02-24 NOTE — ED Triage Notes (Signed)
EMS reports pt was at [facility] medical for his mother's appt.  Started having sharp stabbing pain in chest radiating up to r jaw.  C/O headache in frontal lobe. Dr's office gave pt a nitro and 4 baby asa and called ems.    bp 90/60 and hr 144 when ems arrived.  Pt says pain has eased.

## 2020-02-24 NOTE — Progress Notes (Signed)
ANTICOAGULATION CONSULT NOTE - Initial Consult  Pharmacy Consult for heparin Indication: chest pain/ACS  Allergies  Allergen Reactions  . Nsaids Other (See Comments)    Internal bleeding   . Cymbalta [Duloxetine Hcl] Other (See Comments)    Blood sugar "bottomed out"    Patient Measurements: Height: 5\' 9"  (175.3 cm) Weight: 115.2 kg (254 lb) IBW/kg (Calculated) : 70.7  HEPARIN DW (KG): 96.4   Vital Signs: Temp: 97.6 F (36.4 C) (07/16 1643) Temp Source: Oral (07/16 1643) BP: 127/95 (07/16 1800) Pulse Rate: 98 (07/16 1800)  Labs: Recent Labs    02/24/20 1646 02/24/20 1650  HGB 14.6  --   HCT 43.6  --   PLT 238  --   CREATININE  --  1.17  TROPONINIHS 45*  --     Estimated Creatinine Clearance: 90.3 mL/min (by C-G formula based on SCr of 1.17 mg/dL).   Medical History: Past Medical History:  Diagnosis Date  . IV drug user     Assessment: 54 y.o. male presenting with chest pain and found to be in ACS. Pharmacy has been consulted for IV heparin dosing.   Hgb and platelets WNL. No bleeding noted. No PTA anticoagulation.  Goal of Therapy:  Heparin level 0.3-0.7 units/ml Monitor platelets by anticoagulation protocol: Yes  Plan:  Heparin bolus at 4000 units Then start heparin infusion at 1300 units/hr Obtain 6h heparin level Monitor CBC, daily heparin level  Continue to monitor for signs/symptoms of bleeding F/u cardiac workup  57, PharmD Clinical Pharmacist  02/24/2020   6:29 PM   Please check AMION for all [facility] Pharmacy phone numbers After 10:00 PM, call the Main Pharmacy [phone]

## 2020-02-24 NOTE — ED Provider Notes (Signed)
[facility] EMERGENCY DEPARTMENT Provider Note   CSN: [id number] Arrival date & time: 02/24/20  1635     History Chief Complaint  Patient presents with  . Chest Pain    Jerome Camacho is a 54 y.o. male with a history of tobacco abuse & prior IVDU in remission on suboxone since January 2021 who presents to the ED with complaints of chest pain that began this afternoon. Patient states he was working on the car to take his mother to her doctor's appointment and noted some chest discomfort into his jaw that he thought was reflux initially, this subsided, but then after he got to the doctors office with his mother and was sitting inside he developed severe pain to the central chest that radiated into the jaw & LUE, felt like stabbing pain, with associated N/V & diaphoresis. EMS was called, patient was given 1 tablet of aspirin (unknown dose) and 1 SL NTG. Pain initially 9/10 in severity, improved following NTG, currently minimal 1/10 and continues to improve. No hx of similar pain. Other than NTG, no other alleviating/aggravating factors. He states his father died of an MI at age 53. He has not had prior stress testing. He denies dyspnea, syncope, leg pain/swelling, hemoptysis, recent surgery/trauma, recent long travel, hormone use, personal hx of cancer, or hx of DVT/PE. He has not had much to eat today.      HPI     Past Medical History:  Diagnosis Date  . IV drug user     Patient Active Problem List   Diagnosis Date Noted  . Substance induced mood disorder (HCC) 02/28/2015    Past Surgical History:  Procedure Laterality Date  . APPENDECTOMY    . BACK SURGERY         History reviewed. No pertinent family history.  Social History   Tobacco Use  . Smoking status: Heavy Tobacco Smoker    Packs/day: 4.00    Types: Cigarettes  . Smokeless tobacco: Never Used  Vaping Use  . Vaping Use: Never used  Substance Use Topics  . Alcohol use: No  . Drug use: Not Currently     Types: IV    Home Medications Prior to Admission medications   Medication Sig Start Date End Date Taking? Authorizing Provider  amitriptyline (ELAVIL) 100 MG tablet Take 1 tablet (100 mg total) by mouth at bedtime. 03/06/15   Thermon Leyland, NP  lidocaine (LIDODERM) 5 % Place 1 patch onto the skin daily. Remove & Discard patch within 12 hours or as directed by MD 03/06/15   Thermon Leyland, NP  methocarbamol (ROBAXIN) 500 MG tablet Take 1 tablet (500 mg total) by mouth every 8 (eight) hours as needed for muscle spasms. 08/30/19   Pricilla Loveless, MD  nicotine (NICODERM CQ - DOSED IN MG/24 HOURS) 21 mg/24hr patch Place 1 patch (21 mg total) onto the skin daily. 03/06/15   Thermon Leyland, NP  traZODone (DESYREL) 50 MG tablet Take 1 tablet (50 mg total) by mouth at bedtime as needed for sleep. 03/06/15   Thermon Leyland, NP    Allergies    Nsaids and Cymbalta [duloxetine hcl]  Review of Systems   Review of Systems  Constitutional: Positive for diaphoresis. Negative for fever.  Respiratory: Negative for shortness of breath.   Cardiovascular: Positive for chest pain. Negative for leg swelling.  Gastrointestinal: Positive for nausea. Negative for abdominal pain and vomiting.  Neurological: Negative for syncope, speech difficulty, weakness and numbness.  All  other systems reviewed and are negative.   Physical Exam Updated Vital Signs BP 109/80 (BP Location: Left Arm)   Pulse (!) 111   Temp 97.6 F (36.4 C) (Oral)   Resp 18   Ht 5\' 9"  (1.753 m)   Wt 115.2 kg   SpO2 100%   BMI 37.51 kg/m   Physical Exam Vitals and nursing note reviewed.  Constitutional:      General: He is not in acute distress.    Appearance: He is well-developed. He is not toxic-appearing.  HENT:     Head: Normocephalic and atraumatic.  Eyes:     General:        Right eye: No discharge.        Left eye: No discharge.     Conjunctiva/sclera: Conjunctivae normal.  Cardiovascular:     Rate and Rhythm: Regular  rhythm. Tachycardia present.     Pulses:          Radial pulses are 2+ on the right side and 2+ on the left side.     Heart sounds: No murmur heard.   Pulmonary:     Effort: Pulmonary effort is normal. No respiratory distress.     Breath sounds: Normal breath sounds. No wheezing, rhonchi or rales.  Chest:     Chest wall: No tenderness.  Abdominal:     General: There is no distension.     Palpations: Abdomen is soft.     Tenderness: There is no abdominal tenderness.  Musculoskeletal:     Cervical back: Neck supple.     Right lower leg: No tenderness. No edema.     Left lower leg: No tenderness. No edema.  Skin:    General: Skin is warm and dry.     Findings: No rash.  Neurological:     Mental Status: He is alert.     Comments: Clear speech.   Psychiatric:        Behavior: Behavior normal.     ED Results / Procedures / Treatments   Labs (all labs ordered are listed, but only abnormal results are displayed) Labs Reviewed  CBC  COMPREHENSIVE METABOLIC PANEL  CBG MONITORING, ED  TROPONIN I (HIGH SENSITIVITY)    EKG EKG Interpretation  Date/Time:  Friday February 24 2020 16:41:58 EDT Ventricular Rate:  106 PR Interval:    QRS Duration: 105 QT Interval:  357 QTC Calculation: 475 R Axis:   69 Text Interpretation: Sinus tachycardia ST elev, probable normal early repol pattern No significant change since last tracing Confirmed by [id number] [phone]) on 02/24/2020 4:44:49 PM   Radiology DG Chest 2 View  Result Date: 02/24/2020 CLINICAL DATA:  Chest pain EXAM: CHEST - 2 VIEW COMPARISON:  None. FINDINGS: The lungs are well expanded and are symmetric. Mild right basilar scarring. The lungs are otherwise clear. No pneumothorax or pleural effusion. Cardiac size within normal limits. The pulmonary vascularity is normal. No acute bone abnormality. IMPRESSION: No active cardiopulmonary disease. Electronically Signed   By: 02/26/2020 MD   On: 02/24/2020 17:40     Procedures .Critical Care Performed by: 02/26/2020, PA-C Authorized by: Cherly Anderson, PA-C     CRITICAL CARE Performed by: Cherly Anderson  Total critical care time: 30 minutes  Critical care time was exclusive of separately billable procedures and treating other patients.  Critical care was necessary to treat or prevent imminent or life-threatening deterioration.  Critical care was time spent personally by me on the following activities: development  of treatment plan with patient and/or surrogate as well as nursing, discussions with consultants, evaluation of patient's response to treatment, examination of patient, obtaining history from patient or surrogate, ordering and performing treatments and interventions, ordering and review of laboratory studies, ordering and review of radiographic studies, pulse oximetry and re-evaluation of patient's condition.   (including critical care time)  Medications Ordered in ED Medications  sodium chloride flush (NS) 0.9 % injection 3 mL (has no administration in time range)  aspirin chewable tablet 243 mg (has no administration in time range)  sodium chloride 0.9 % bolus 1,000 mL (has no administration in time range)    ED Course  I have reviewed the triage vital signs and the nursing notes.  Pertinent labs & imaging results that were available during my care of the patient were reviewed by me and considered in my medical decision making (see chart for details).    MDM Rules/Calculators/A&P                         Patient presents to the ED with complaints of chest pain, improved S/p nitroglycerin PTA. He likely had 81 mg of Aspirin therefore additional aspirin ordered in the ED for full 324 mg dose. Exam fairly benign, mild tachycardia present.   DDX: ACS, pulmonary embolism, dissection, pneumothorax, pneumonia, arrhythmia, severe anemia, MSK, GERD, anxiety. Evaluation initiated with labs, EKG, and CXR.  Patient on cardiac monitor.   Additional history obtained:  Additional history obtained from EMS. Previous records obtained and reviewed .   EKG: No STEMI, no significant change from prior.  Lab Tests:  I Ordered, reviewed, and interpreted labs, which included:  CBC: Mild leukocytosis of 14.2 felt to be nonspecific CMP: mild electrolyte abnormalities as above.  Troponin: Elevated at 45 UDS: negative other than consistent w/ patient's reported marijuana use.  Imaging Studies ordered:  I ordered imaging studies which included CXR, I independently visualized and interpreted imaging which showed No active cardiopulmonary disease.  Patient with concerning story for ACS, his EKG does not appear significantly changed from prior, his troponin is mildly elevated at 45, discussed findings and plan of care with supervising physician Dr. Rubin Payor- will heparanize & discuss with cardiology- plan for admission. Patient is in agreement.   19:09: CONSULT: Discussed case with Dr.Croitoru- recommends hospitalist admission to [facility]- appreciate consutlation.   Dr. Rubin Payor has discussed with hospitalist service- accept admission.   Portions of this note were generated with Scientist, clinical (histocompatibility and immunogenetics). Dictation errors may occur despite best attempts at proofreading.  Final Clinical Impression(s) / ED Diagnoses Final diagnoses:  Chest pain, unspecified type    Rx / DC Orders ED Discharge Orders    None       Desmond Lope 02/24/20 1952    Benjiman Core, MD 02/24/20 2345

## 2020-02-25 ENCOUNTER — Observation Stay (HOSPITAL_BASED_OUTPATIENT_CLINIC_OR_DEPARTMENT_OTHER): Payer: Medicare Other

## 2020-02-25 DIAGNOSIS — F17210 Nicotine dependence, cigarettes, uncomplicated: Secondary | ICD-10-CM | POA: Diagnosis present

## 2020-02-25 DIAGNOSIS — Z886 Allergy status to analgesic agent status: Secondary | ICD-10-CM | POA: Diagnosis not present

## 2020-02-25 DIAGNOSIS — R0789 Other chest pain: Secondary | ICD-10-CM

## 2020-02-25 DIAGNOSIS — Z79899 Other long term (current) drug therapy: Secondary | ICD-10-CM | POA: Diagnosis not present

## 2020-02-25 DIAGNOSIS — R079 Chest pain, unspecified: Secondary | ICD-10-CM | POA: Diagnosis present

## 2020-02-25 DIAGNOSIS — E876 Hypokalemia: Secondary | ICD-10-CM | POA: Diagnosis present

## 2020-02-25 DIAGNOSIS — E785 Hyperlipidemia, unspecified: Secondary | ICD-10-CM | POA: Diagnosis present

## 2020-02-25 DIAGNOSIS — R778 Other specified abnormalities of plasma proteins: Secondary | ICD-10-CM | POA: Diagnosis present

## 2020-02-25 DIAGNOSIS — N400 Enlarged prostate without lower urinary tract symptoms: Secondary | ICD-10-CM | POA: Diagnosis present

## 2020-02-25 DIAGNOSIS — Z20822 Contact with and (suspected) exposure to covid-19: Secondary | ICD-10-CM | POA: Diagnosis present

## 2020-02-25 DIAGNOSIS — D72829 Elevated white blood cell count, unspecified: Secondary | ICD-10-CM | POA: Diagnosis present

## 2020-02-25 DIAGNOSIS — Z8249 Family history of ischemic heart disease and other diseases of the circulatory system: Secondary | ICD-10-CM | POA: Diagnosis not present

## 2020-02-25 DIAGNOSIS — I10 Essential (primary) hypertension: Secondary | ICD-10-CM | POA: Diagnosis present

## 2020-02-25 DIAGNOSIS — E871 Hypo-osmolality and hyponatremia: Secondary | ICD-10-CM | POA: Diagnosis present

## 2020-02-25 LAB — HEPARIN LEVEL (UNFRACTIONATED)
Heparin Unfractionated: 0.14 IU/mL — ABNORMAL LOW (ref 0.30–0.70)
Heparin Unfractionated: 0.3 IU/mL (ref 0.30–0.70)

## 2020-02-25 LAB — CBC
HCT: 43.5 % (ref 39.0–52.0)
Hemoglobin: 13.9 g/dL (ref 13.0–17.0)
MCH: 29.6 pg (ref 26.0–34.0)
MCHC: 32 g/dL (ref 30.0–36.0)
MCV: 92.8 fL (ref 80.0–100.0)
Platelets: 203 10*3/uL (ref 150–400)
RBC: 4.69 MIL/uL (ref 4.22–5.81)
RDW: 14.1 % (ref 11.5–15.5)
WBC: 9 10*3/uL (ref 4.0–10.5)
nRBC: 0 % (ref 0.0–0.2)

## 2020-02-25 LAB — BASIC METABOLIC PANEL
Anion gap: 10 (ref 5–15)
BUN: 6 mg/dL (ref 6–20)
CO2: 23 mmol/L (ref 22–32)
Calcium: 8.8 mg/dL — ABNORMAL LOW (ref 8.9–10.3)
Chloride: 103 mmol/L (ref 98–111)
Creatinine, Ser: 0.91 mg/dL (ref 0.61–1.24)
GFR calc Af Amer: >60 mL/min (ref >60)
GFR calc non Af Amer: >60 mL/min (ref >60)
Glucose, Bld: 117 mg/dL — ABNORMAL HIGH (ref 70–99)
Potassium: 4.7 mmol/L (ref 3.5–5.1)
Sodium: 136 mmol/L (ref 135–145)

## 2020-02-25 LAB — ECHOCARDIOGRAM COMPLETE
Area-P 1/2: 3.65 cm2
Height: 69 in
S' Lateral: 3.3 cm
Weight: 4014.4 oz

## 2020-02-25 LAB — TROPONIN I (HIGH SENSITIVITY): Troponin I (High Sensitivity): 11 ng/L (ref <18)

## 2020-02-25 LAB — HIV ANTIBODY (ROUTINE TESTING W REFLEX): HIV Screen 4th Generation wRfx: NONREACTIVE

## 2020-02-25 MED ORDER — ASPIRIN EC 81 MG PO TBEC
81.0000 mg | DELAYED_RELEASE_TABLET | Freq: Every day | ORAL | Status: DC
  Administered 2020-02-25 – 2020-02-26 (×2): 81 mg via ORAL
  Filled 2020-02-25 (×2): qty 1

## 2020-02-25 MED ORDER — ESCITALOPRAM OXALATE 10 MG PO TABS
20.0000 mg | ORAL_TABLET | Freq: Every day | ORAL | Status: DC
  Administered 2020-02-25 – 2020-02-26 (×2): 20 mg via ORAL
  Filled 2020-02-25 (×2): qty 2

## 2020-02-25 MED ORDER — GABAPENTIN 300 MG PO CAPS
900.0000 mg | ORAL_CAPSULE | Freq: Three times a day (TID) | ORAL | Status: DC
  Administered 2020-02-25 – 2020-02-26 (×3): 900 mg via ORAL
  Filled 2020-02-25 (×3): qty 3

## 2020-02-25 NOTE — Progress Notes (Signed)
Patient arrived via [facility] from [facility].  Tele monitor applied on patient and verified with Maxine Glenn, Consulting civil engineer.  Skin assessment completed with Maxine Glenn, Consulting civil engineer.  Admission MD paged and notified of patient's arrival.  Assessment and admission completed.  Denies chest pain and able to demonstrate how to use the call bell correctly to notify staff.  Heparin gtt infusing per order.

## 2020-02-25 NOTE — Consult Note (Signed)
Cardiology Consultation:   Patient ID: Jerome Camacho MRN: [id number]; DOB: [birth year]  Admit date: 02/24/2020 Date of Consult: 02/25/2020  Primary Care Provider: [facility] Cardiologist: [facility] Electrophysiologist:  None   Patient Profile:   Jerome Camacho is a 54 y.o. male with a hx of tobacco abuse who is being seen today for the evaluation of chest pain at the request of Dr .Thomes Dinning  History of Present Illness:   Jerome Camacho 54 yo male history of tobacco abuse, prior IV drug use admitted with chest pain. Reports episode started yesterday while driving mother to doctors office. 4/10 sharp pain midchest radiating into right jaw. No other associated symptoms. At doctors office pain increased to 6/10 . He let the doctor at the office know, was given some ASA and SL NG with some improvement. No recurrent pain since admission. He does report some ongoing DOE over the last few weeks.    WBC 14.2 Hgb 14.6 Plt 238 K 3.2 Cr 1.17 BUN 8 LDL 142  Trop 45-->45--> UDS +THC COVID neg EKG sinus tach, no acute ischemic changes    Past Medical History:  Diagnosis Date  . IV drug user     Past Surgical History:  Procedure Laterality Date  . APPENDECTOMY    . BACK SURGERY       Inpatient Medications: Scheduled Meds: . aspirin EC  81 mg Oral Daily  . atorvastatin  80 mg Oral Daily  . buprenorphine-naloxone  1 tablet Sublingual Q6H  . cloNIDine  0.1 mg Oral TID  . nicotine  21 mg Transdermal Daily  . tamsulosin  0.8 mg Oral QPC supper   Continuous Infusions: . heparin 1,300 Units/hr (02/24/20 1909)   PRN Meds: acetaminophen, nitroGLYCERIN, ondansetron (ZOFRAN) IV  Allergies:    Allergies  Allergen Reactions  . Nsaids Other (See Comments)    Internal bleeding   . Cymbalta [Duloxetine Hcl] Other (See Comments)    Blood sugar "bottomed out"    Social History:   Social History   Socioeconomic History  . Marital  status: Married    Spouse name: Not on file  . Number of children: Not on file  . Years of education: Not on file  . Highest education level: Not on file  Occupational History  . Not on file  Tobacco Use  . Smoking status: Heavy Tobacco Smoker    Packs/day: 4.00    Types: Cigarettes  . Smokeless tobacco: Never Used  Vaping Use  . Vaping Use: Never used  Substance and Sexual Activity  . Alcohol use: No  . Drug use: Not Currently    Types: IV  . Sexual activity: Never  Other Topics Concern  . Not on file  Social History Narrative  . Not on file   Social Determinants of Health   Financial Resource Strain:   . Difficulty of Paying Living Expenses:   Food Insecurity:   . Worried About Programme researcher, broadcasting/film/video in the Last Year:   . Barista in the Last Year:   Transportation Needs:   . Freight forwarder (Medical):   Marland Kitchen Lack of Transportation (Non-Medical):   Physical Activity:   . Days of Exercise per Week:   . Minutes of Exercise per Session:   Stress:   . Feeling of Stress :   Social Connections:   . Frequency of Communication with Friends and Family:   . Frequency of Social Gatherings with Friends  and Family:   . Attends Religious Services:   . Active Member of Clubs or Organizations:   . Attends Banker Meetings:   Marland Kitchen Marital Status:   Intimate Partner Violence:   . Fear of Current or Ex-Partner:   . Emotionally Abused:   Marland Kitchen Physically Abused:   . Sexually Abused:     Family History:   Father CABG mid 80s  ROS:  Please see the history of present illness.  All other ROS reviewed and negative.     Physical Exam/Data:   Vitals:   02/24/20 2355 02/25/20 0000 02/25/20 0105 02/25/20 0458  BP: 120/77 128/74 (!) 142/95 122/75  Pulse: 86 82 85 71  Resp: 17 14 18 18   Temp: 98.8 F (37.1 C)  98.6 F (37 C) 98.1 F (36.7 C)  TempSrc:   Oral Oral  SpO2: 97% 95% 100% 95%  Weight:   113.8 kg   Height:   5\' 9"  (1.753 m)     Intake/Output  Summary (Last 24 hours) at 02/25/2020 0749 Last data filed at 02/25/2020 0351 Gross per 24 hour  Intake 1141.13 ml  Output --  Net 1141.13 ml   Last 3 Weights 02/25/2020 02/24/2020 08/30/2019  Weight (lbs) 250 lb 14.4 oz 254 lb 240 lb  Weight (kg) 113.807 kg 115.214 kg 108.863 kg  Some encounter information is confidential and restricted. Go to Review Flowsheets activity to see all data.     Body mass index is 37.05 kg/m.  General:  Well nourished, well developed, in no acute distress HEENT: normal Lymph: no adenopathy Neck: no JVD Endocrine:  No thryomegaly Vascular: No carotid bruits; FA pulses 2+ bilaterally without bruits  Cardiac:  normal S1, S2; RRR; no murmur  Lungs:  clear to auscultation bilaterally, no wheezing, rhonchi or rales  Abd: soft, nontender, no hepatomegaly  Ext: no edema Musculoskeletal:  No deformities, BUE and BLE strength normal and equal Skin: warm and dry  Neuro:  CNs 2-12 intact, no focal abnormalities noted Psych:  Normal affect     Laboratory Data:  High Sensitivity Troponin:   Recent Labs  Lab 02/24/20 1646 02/24/20 1858  TROPONINIHS 45* 45*     Chemistry Recent Labs  Lab 02/24/20 1650  NA 132*  K 3.2*  CL 97*  CO2 22  GLUCOSE 105*  BUN 8  CREATININE 1.17  CALCIUM 8.9  GFRNONAA >60  GFRAA >60  ANIONGAP 13    Recent Labs  Lab 02/24/20 1650  PROT 7.6  ALBUMIN 3.8  AST 25  ALT 19  ALKPHOS 182*  BILITOT 0.8   Hematology Recent Labs  Lab 02/24/20 1646 02/25/20 0225  WBC 14.2* 9.0  RBC 4.79 4.69  HGB 14.6 13.9  HCT 43.6 43.5  MCV 91.0 92.8  MCH 30.5 29.6  MCHC 33.5 32.0  RDW 14.0 14.1  PLT 238 203   BNPNo results for input(s): BNP, PROBNP in the last 168 hours.  DDimer No results for input(s): DDIMER in the last 168 hours.   Radiology/Studies:  DG Chest 2 View  Result Date: 02/24/2020 CLINICAL DATA:  Chest pain EXAM: CHEST - 2 VIEW COMPARISON:  None. FINDINGS: The lungs are well expanded and are symmetric. Mild  right basilar scarring. The lungs are otherwise clear. No pneumothorax or pleural effusion. Cardiac size within normal limits. The pulmonary vascularity is normal. No acute bone abnormality. IMPRESSION: No active cardiopulmonary disease. Electronically Signed   By: 02/27/20 MD   On: 02/24/2020 17:40   {  Assessment and Plan:   1. Chest pain - symptoms concerning for possible unstable angina. Midchest into jaw, improved with NG - CAD risk factors: tobacco, HL, father CABG mid 79s - mild flat trop 45 x 2, repeat trop this AM - obtain echo - medical therapy with ASA 81, atorva 80, hep gtt  - would obtain additional info today with repeat EKG, trop, and echo to decide between either stress testing vs cath. Given diet today, npo at midnight.     For questions or updates, please contact [facility] Please consult [email or web address] for contact info under    Signed, Dina Rich, MD  02/25/2020 7:49 AM

## 2020-02-25 NOTE — Progress Notes (Signed)
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  He complained of midsternal sharp chest pain with radiation to the jaw.  He was given baby aspirin and sublingual nitrogen while he was accompanying his mother at her doctor's office.  Patient was then brought to the emergency department.Cardiology consulted and following.  Plan for stress test versus cath.  Assessment & Plan:   Principal Problem:   Chest pain Active Problems:   Substance induced mood disorder (HCC)   Essential hypertension   Hyperlipidemia   BPH (benign prostatic hyperplasia)   Leukocytosis   Hyponatremia   Hypokalemia   Troponin level elevated   Tobacco abuse   Substance abuse (HCC)   Chest pain: Presented with chest pain with typical symptoms.  Concern for unstable angina.  Pain was relieved with nitroglycerin.  Troponins were mild elevated.  He started on heparin.  On aspirin, nitroglycerin as needed.  Cardiology following.  Plan for stress test versus cath tomorrow.  Echocardiogram showed ejection fraction of 60 to 65%, grade 1 diastolic dysfunction, no wall motion abnormality.  Currently chest pain-free.  Leukocytosis: Most likely reactive.  No concern for any infectious etiology.  Resolved  Hyponatremia/hypokalemia: Being monitored and supplemented  Hypertension: Continue clonidine per home regimen.  Monitor blood pressure.  Hyperlipidemia: On Lipitor.  LDL of 142  BPH: On Flomax at home  Tobacco abuse/substance abuse: Counseled for tobacco cessation.  Continue nicotine patch         DVT prophylaxis: Lovenox Code Status: Full code Family Communication: Wife on phone on 02/25/20 Status is: Observation  The patient remains OBS  appropriate and will d/c before 2 midnights.  Dispo: The patient is from: Home              Anticipated d/c is to: Home              Anticipated d/c date is: 1 day              Patient currently is not medically stable to d/c.    Consultants: cardiology  Procedures:None  Antimicrobials:  Anti-infectives (From admission, onward)   None      Subjective: Patient seen and examined at the bedside this morning.  Hemodynamically stable.  Comfortable.  Currently chest pain-free.  Objective: Vitals:   02/25/20 0000 02/25/20 0105 02/25/20 0458 02/25/20 0753  BP: 128/74 (!) 142/95 122/75 106/66  Pulse: 82 85 71 74  Resp: 14 18 18 18   Temp:  98.6 F (37 C) 98.1 F (36.7 C) 98 F (36.7 C)  TempSrc:  Oral Oral Oral  SpO2: 95% 100% 95% 98%  Weight:  113.8 kg    Height:  5\' 9"  (1.753 m)      Intake/Output Summary (Last 24 hours) at 02/25/2020 0856 Last data filed at 02/25/2020 0351 Gross per 24 hour  Intake 1141.13 ml  Output --  Net 1141.13 ml   Filed Weights   02/24/20 1640 02/25/20 0105  Weight: 115.2 kg 113.8 kg    Examination:  General exam: Appears calm and comfortable ,Not in distress,average built HEENT:PERRL,Oral mucosa moist, Ear/Nose normal on gross exam Respiratory system: Bilateral equal air entry, normal vesicular breath sounds, no wheezes or crackles  Cardiovascular system: S1 &  S2 heard, RRR. No JVD, murmurs, rubs, gallops or clicks. No pedal edema. Gastrointestinal system: Abdomen is nondistended, soft and nontender. No organomegaly or masses felt. Normal bowel sounds heard. Central nervous system: Alert and oriented.  Extremities: No edema, no clubbing ,no cyanosis Skin: No rashes, lesions or ulcers,no icterus ,no pallor   Data Reviewed: I have personally reviewed following labs and imaging studies  CBC: Recent Labs  Lab 02/24/20 1646 02/25/20 0225  WBC 14.2* 9.0  HGB 14.6 13.9  HCT 43.6 43.5  MCV 91.0 92.8  PLT 238 203   Basic Metabolic  Panel: Recent Labs  Lab 02/24/20 1650  NA 132*  K 3.2*  CL 97*  CO2 22  GLUCOSE 105*  BUN 8  CREATININE 1.17  CALCIUM 8.9   GFR: Estimated Creatinine Clearance: 89.7 mL/min (by C-G formula based on SCr of 1.17 mg/dL). Liver Function Tests: Recent Labs  Lab 02/24/20 1650  AST 25  ALT 19  ALKPHOS 182*  BILITOT 0.8  PROT 7.6  ALBUMIN 3.8   No results for input(s): LIPASE, AMYLASE in the last 168 hours. No results for input(s): AMMONIA in the last 168 hours. Coagulation Profile: No results for input(s): INR, PROTIME in the last 168 hours. Cardiac Enzymes: No results for input(s): CKTOTAL, CKMB, CKMBINDEX, TROPONINI in the last 168 hours. BNP (last 3 results) No results for input(s): PROBNP in the last 8760 hours. HbA1C: No results for input(s): HGBA1C in the last 72 hours. CBG: Recent Labs  Lab 02/24/20 1800  GLUCAP 104*   Lipid Profile: Recent Labs    02/24/20 1655  CHOL 192  HDL 24*  LDLCALC 142*  TRIG 130  CHOLHDL 8.0   Thyroid Function Tests: No results for input(s): TSH, T4TOTAL, FREET4, T3FREE, THYROIDAB in the last 72 hours. Anemia Panel: No results for input(s): VITAMINB12, FOLATE, FERRITIN, TIBC, IRON, RETICCTPCT in the last 72 hours. Sepsis Labs: No results for input(s): PROCALCITON, LATICACIDVEN in the last 168 hours.  Recent Results (from the past 240 hour(s))  SARS Coronavirus 2 by RT PCR (hospital order, performed in [facility] hospital lab) Nasopharyngeal Nasopharyngeal Swab     Status: None   Collection Time: 02/24/20  5:48 PM   Specimen: Nasopharyngeal Swab  Result Value Ref Range Status   SARS Coronavirus 2 NEGATIVE NEGATIVE Final    Comment: (NOTE) SARS-CoV-2 target nucleic acids are NOT DETECTED.  The SARS-CoV-2 RNA is generally detectable in upper and lower respiratory specimens during the acute phase of infection. The lowest concentration of SARS-CoV-2 viral copies this assay can detect is 250 copies / mL. A negative result  does not preclude SARS-CoV-2 infection and should not be used as the sole basis for treatment or other patient management decisions.  A negative result may occur with improper specimen collection / handling, submission of specimen other than nasopharyngeal swab, presence of viral mutation(s) within the areas targeted by this assay, and inadequate number of viral copies (<250 copies / mL). A negative result must be combined with clinical observations, patient history, and epidemiological information.  Fact Sheet for Patients:   BoilerBrush.com.cy  Fact Sheet for Healthcare Providers: https://pope.com/  This test is not yet approved or  cleared by the [place] FDA and has been authorized for detection and/or diagnosis of SARS-CoV-2 by FDA under an Emergency Use Authorization (EUA).  This EUA will remain in effect (meaning this test can be used) for the duration of the COVID-19 declaration under Section 564(b)(1) of the Act, 21 U.S.C. section 360bbb-3(b)(1),  unless the authorization is terminated or revoked sooner.  Performed at [facility], [address], [place], [place] [address]          Radiology Studies: DG Chest 2 View  Result Date: 02/24/2020 CLINICAL DATA:  Chest pain EXAM: CHEST - 2 VIEW COMPARISON:  None. FINDINGS: The lungs are well expanded and are symmetric. Mild right basilar scarring. The lungs are otherwise clear. No pneumothorax or pleural effusion. Cardiac size within normal limits. The pulmonary vascularity is normal. No acute bone abnormality. IMPRESSION: No active cardiopulmonary disease. Electronically Signed   By: Helyn Numbers MD   On: 02/24/2020 17:40        Scheduled Meds: . aspirin EC  81 mg Oral Daily  . atorvastatin  80 mg Oral Daily  . buprenorphine-naloxone  1 tablet Sublingual Q6H  . cloNIDine  0.1 mg Oral TID  . nicotine  21 mg Transdermal Daily  . tamsulosin  0.8 mg Oral QPC supper    Continuous Infusions: . heparin 1,300 Units/hr (02/24/20 1909)     LOS: 0 days    Time spent: 25 mins,More than 50% of that time was spent in counseling and/or coordination of care.      Burnadette Pop, MD [facility] P7/17/2021, 8:56 AM

## 2020-02-25 NOTE — Progress Notes (Signed)
ANTICOAGULATION CONSULT NOTE  Pharmacy Consult for heparin Indication: chest pain/ACS  Allergies  Allergen Reactions  . Nsaids Other (See Comments)    Internal bleeding   . Cymbalta [Duloxetine Hcl] Other (See Comments)    Blood sugar "bottomed out"    Patient Measurements: Height: 5\' 9"  (175.3 cm) Weight: 113.8 kg (250 lb 14.4 oz) IBW/kg (Calculated) : 70.7  HEPARIN DW (KG): 96.4   Vital Signs: Temp: 98 F (36.7 C) (07/17 0753) Temp Source: Oral (07/17 0753) BP: 106/66 (07/17 0753) Pulse Rate: 74 (07/17 0753)  Labs: Recent Labs    02/24/20 1646 02/24/20 1650 02/24/20 1858 02/25/20 0225 02/25/20 0947  HGB 14.6  --   --  13.9  --   HCT 43.6  --   --  43.5  --   PLT 238  --   --  203  --   HEPARINUNFRC  --   --   --  0.14* 0.30  CREATININE  --  1.17  --   --  0.91  TROPONINIHS 45*  --  45*  --  11    Estimated Creatinine Clearance: 115.4 mL/min (by C-G formula based on SCr of 0.91 mg/dL).   Medical History: Past Medical History:  Diagnosis Date  . IV drug user     Assessment: 54 yo male presented with chest pain. Due to elevated initial HS troponin of 45 and chest pain, ACS is being ruled out. Cardiology states symptoms are concerning for possible unstable angina.The patient was not on anticoagulation PTA. Pharmacy has been consulted for IV heparin dosing.  The patient was started on IV heparin with a 4000 unit bolus then 1300 units/hr drip rate. The first 6-hr heparin level came back subtherapeutic at 0.14 but no heparin drip changes were made. Another heparin level was drawn and was therapeutic at 0.3. CBC is WNL and patient is without signs or symptoms of bleeding.  Goal of Therapy:  Heparin level 0.3-0.7 units/ml Monitor platelets by anticoagulation protocol: Yes  Plan:  Continue heparin IV at 1300 units/hr Obtain and monitor daily heparin level and CBC Continue to monitor for signs/symptoms of bleeding   57, PharmD, RPh  PGY-1  Pharmacy Resident 02/25/2020 11:56 AM  Please check [email or web address] for unit-specific pharmacy phone numbers.

## 2020-02-25 NOTE — Progress Notes (Signed)
Echocardiogram 2D Echocardiogram has been performed.  Leta Jungling M 02/25/2020, 10:07 AM

## 2020-02-26 ENCOUNTER — Inpatient Hospital Stay (HOSPITAL_COMMUNITY): Payer: Medicare Other

## 2020-02-26 DIAGNOSIS — R079 Chest pain, unspecified: Secondary | ICD-10-CM

## 2020-02-26 LAB — NM MYOCAR MULTI W/SPECT W/WALL MOTION / EF
Estimated workload: 1 METS
MPHR: 166 {beats}/min
Peak HR: 110 {beats}/min
Percent HR: 66 %
Rest HR: 71 {beats}/min

## 2020-02-26 LAB — CBC
HCT: 43.8 % (ref 39.0–52.0)
Hemoglobin: 14.1 g/dL (ref 13.0–17.0)
MCH: 29.5 pg (ref 26.0–34.0)
MCHC: 32.2 g/dL (ref 30.0–36.0)
MCV: 91.6 fL (ref 80.0–100.0)
Platelets: 200 10*3/uL (ref 150–400)
RBC: 4.78 MIL/uL (ref 4.22–5.81)
RDW: 14.1 % (ref 11.5–15.5)
WBC: 6.2 10*3/uL (ref 4.0–10.5)
nRBC: 0 % (ref 0.0–0.2)

## 2020-02-26 LAB — BASIC METABOLIC PANEL
Anion gap: 8 (ref 5–15)
BUN: 7 mg/dL (ref 6–20)
CO2: 27 mmol/L (ref 22–32)
Calcium: 9.3 mg/dL (ref 8.9–10.3)
Chloride: 102 mmol/L (ref 98–111)
Creatinine, Ser: 0.98 mg/dL (ref 0.61–1.24)
GFR calc Af Amer: >60 mL/min (ref >60)
GFR calc non Af Amer: >60 mL/min (ref >60)
Glucose, Bld: 109 mg/dL — ABNORMAL HIGH (ref 70–99)
Potassium: 4.4 mmol/L (ref 3.5–5.1)
Sodium: 137 mmol/L (ref 135–145)

## 2020-02-26 LAB — HEPARIN LEVEL (UNFRACTIONATED)
Heparin Unfractionated: 0.26 IU/mL — ABNORMAL LOW (ref 0.30–0.70)
Heparin Unfractionated: 0.3 IU/mL (ref 0.30–0.70)

## 2020-02-26 MED ORDER — TECHNETIUM TC 99M TETROFOSMIN IV KIT
10.2000 | PACK | Freq: Once | INTRAVENOUS | Status: AC | PRN
  Administered 2020-02-26: 10.2 via INTRAVENOUS

## 2020-02-26 MED ORDER — REGADENOSON 0.4 MG/5ML IV SOLN
INTRAVENOUS | Status: AC
  Filled 2020-02-26: qty 5

## 2020-02-26 MED ORDER — NICOTINE 21 MG/24HR TD PT24: 21.0000 mg | MEDICATED_PATCH | Freq: Every day | TRANSDERMAL | 0 refills | Status: DC

## 2020-02-26 MED ORDER — AMLODIPINE BESYLATE 5 MG PO TABS: 5.0000 mg | ORAL_TABLET | Freq: Every day | ORAL | 1 refills | Status: DC

## 2020-02-26 MED ORDER — REGADENOSON 0.4 MG/5ML IV SOLN
0.4000 mg | Freq: Once | INTRAVENOUS | Status: AC
  Administered 2020-02-26: 0.4 mg via INTRAVENOUS
  Filled 2020-02-26: qty 5

## 2020-02-26 MED ORDER — TECHNETIUM TC 99M TETROFOSMIN IV KIT
33.0000 | PACK | Freq: Once | INTRAVENOUS | Status: AC | PRN
  Administered 2020-02-26: 33 via INTRAVENOUS

## 2020-02-26 NOTE — Discharge Summary (Signed)
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  He complained of midsternal sharp chest pain with radiation to the jaw.  He was given baby aspirin and sublingual nitrogen while he was accompanying his mother at her doctor's office.  Patient was then brought to the emergency department.Cardiology consulted and was following.    He underwent nuclear stress test which was negative for reversible ischemia.  Cardiology cleared him for discharge.  Following problems were addressed during his hospitalization:   Chest pain: Presented with chest pain with typical symptoms.  Concern for unstable angina.  Pain was relieved with nitroglycerin.  Troponins were mild elevated.  He started on heparin.   Cardiology following.    Echocardiogram showed ejection fraction of 60 to 65%, grade 1 diastolic dysfunction, no wall motion abnormality.  Currently chest pain-free.  Lexiscan did not show any reversible ischemia.  Leukocytosis: Most likely reactive.  No concern for any infectious etiology.  Resolved  Hyponatremia: Supplemented  Hypertension: Started on amlodipine  Hyperlipidemia: On Lipitor.  LDL of 142  BPH: On Flomax at home  Tobacco abuse/substance abuse: Smokes 2 to 3 packs a day .  Counseled for tobacco cessation.    Discharge Diagnoses:  Principal Problem:   Chest pain Active Problems:   Substance induced mood disorder (HCC)   Essential hypertension   Hyperlipidemia   BPH (benign prostatic hyperplasia)   Leukocytosis    Hyponatremia   Hypokalemia   Troponin level elevated   Tobacco abuse   Substance abuse [facility])    Discharge Instructions  Discharge Instructions    Diet - low sodium heart healthy   Complete by: As directed    Discharge instructions   Complete by: As directed    1)Please follow up with your PCP in a week. 2)Please take prescribed medication as instructed 3)Please stop smoking   Increase activity slowly   Complete by: As directed      Allergies as of 02/26/2020      Reactions   Nsaids Other (See Comments)   Internal bleeding    Cymbalta [duloxetine Hcl] Other (See Comments)   Blood sugar "bottomed out"      Medication List    TAKE these medications   amLODipine 5 MG tablet Commonly known as: NORVASC Take 1 tablet (5 mg total) by mouth daily.   aspirin EC 81 MG tablet Take 81 mg by mouth once. Swallow whole.   atorvastatin 80 MG tablet Commonly known as: LIPITOR Take 80 mg by mouth daily.   buprenorphine-naloxone 8-2 mg Subl SL tablet Commonly known as: SUBOXONE Place 0.5 tablets under the tongue in the morning, at noon, in the evening, and at bedtime.   cloNIDine 0.1 MG tablet Commonly known as: CATAPRES Take 0.1 mg by mouth 3 (three) times daily as needed.   cyclobenzaprine 10 MG tablet Commonly known as: FLEXERIL Take 10 mg by mouth every 8 (eight) hours.   escitalopram 20 MG tablet Commonly known as: LEXAPRO Take 1 tablet by mouth daily.   fluticasone 50 MCG/ACT nasal spray Commonly known as: FLONASE Place 1 spray into both nostrils  daily as needed for allergies.   gabapentin 300 MG capsule Commonly known as: NEURONTIN Take 900 mg by mouth 3 (three) times daily.   nicotine 21 mg/24hr patch Commonly known as: NICODERM CQ - dosed in mg/24 hours Place 1 patch (21 mg total) onto the skin daily. Start taking on: February 27, 2020   nitroGLYCERIN 0.4 MG SL tablet Commonly known as: NITROSTAT Place 0.4 mg under the tongue every 5 (five) minutes as needed  for chest pain.   predniSONE 20 MG tablet Commonly known as: DELTASONE Take 20 mg by mouth daily as needed.   tamsulosin 0.4 MG Caps capsule Commonly known as: FLOMAX Take 0.8 mg by mouth daily after supper.       Follow-up Information    [facility] Schedule an appointment as soon as possible for a visit in 1 week(s).   Contact information: [address] [place] [address] [phone]              Allergies  Allergen Reactions  . Nsaids Other (See Comments)    Internal bleeding   . Cymbalta [Duloxetine Hcl] Other (See Comments)    Blood sugar "bottomed out"    Consultations:  Cardiology   Procedures/Studies: DG Chest 2 View  Result Date: 02/24/2020 CLINICAL DATA:  Chest pain EXAM: CHEST - 2 VIEW COMPARISON:  None. FINDINGS: The lungs are well expanded and are symmetric. Mild right basilar scarring. The lungs are otherwise clear. No pneumothorax or pleural effusion. Cardiac size within normal limits. The pulmonary vascularity is normal. No acute bone abnormality. IMPRESSION: No active cardiopulmonary disease. Electronically Signed   By: Helyn Numbers MD   On: 02/24/2020 17:40   NM Myocar Multi W/Spect W/Wall Motion / EF  Result Date: 02/26/2020 CLINICAL DATA:  54 year old male with a history chest pain EXAM: MYOCARDIAL IMAGING WITH SPECT (REST AND PHARMACOLOGIC-STRESS) GATED LEFT VENTRICULAR WALL MOTION STUDY LEFT VENTRICULAR EJECTION FRACTION TECHNIQUE: Standard myocardial SPECT imaging was performed after resting intravenous injection of 10.2 mCi Tc-99m tetrofosmin. Subsequently, intravenous infusion of Lexiscan was performed under the supervision of the Cardiology staff. At peak effect of the drug, 33.0 mCi Tc-61m tetrofosmin was injected intravenously and standard myocardial SPECT imaging was performed. Quantitative gated imaging was also performed to evaluate left ventricular wall motion, and estimate left ventricular ejection fraction.  COMPARISON:  None. FINDINGS: Perfusion: No decreased activity in the left ventricle on stress imaging to suggest reversible ischemia or infarction. Wall Motion: Normal left ventricular wall motion. No left ventricular dilation. Left Ventricular Ejection Fraction: 61 % End diastolic volume 113 ml End systolic volume 44 ml IMPRESSION: 1. No reversible ischemia or infarction. 2. Normal left ventricular wall motion. 3. Left ventricular ejection fraction 61% 4. Non invasive risk stratification*: Low *2012 Appropriate Use Criteria for Coronary Revascularization Focused Update: J Am Coll Cardiol. 2012;59(9):857-881. http://content.dementiazones.com.aspx?articleid=[email or web address] Electronically Signed   By: Gilmer Mor D.O.   On: 02/26/2020 15:11   ECHOCARDIOGRAM COMPLETE  Result Date: 02/25/2020    ECHOCARDIOGRAM REPORT   Patient Name:   CLEVEN JANSMA Date of Exam: 02/25/2020 Medical Rec #:  [id number]         Height:       69.0 in Accession #:    [id number]        Weight:       250.9 lb Date of Birth:  12-04-1965         BSA:          2.275 m Patient  Age:    54 years          BP:           106/66 mmHg Patient Gender: M                 HR:           74 bpm. Exam Location:  Inpatient Procedure: 2D Echo and Strain Analysis Indications:    Chest Pain 786.50 / R07.9  History:        Patient has no prior history of Echocardiogram examinations.                 Risk Factors:Current Smoker.  Sonographer:    Leta Jungling RDCS Referring Phys: [id number] Dorothe Pea BRANCH IMPRESSIONS  1. Global longitudinal strain is -21%.. Left ventricular ejection fraction, by estimation, is 60 to 65%. The left ventricle has normal function. The left ventricle has no regional wall motion abnormalities. Left ventricular diastolic parameters are consistent with Grade I diastolic dysfunction (impaired relaxation).  2. Right ventricular systolic function is normal. The right ventricular size is normal.  3. Right atrial size was mildly dilated.  4.  The mitral valve is grossly normal. No evidence of mitral valve regurgitation.  5. The aortic valve is normal in structure. Aortic valve regurgitation is not visualized. FINDINGS  Left Ventricle: Global longitudinal strain is -21%. Left ventricular ejection fraction, by estimation, is 60 to 65%. The left ventricle has normal function. The left ventricle has no regional wall motion abnormalities. The left ventricular internal cavity size was normal in size. There is no left ventricular hypertrophy. Left ventricular diastolic parameters are consistent with Grade I diastolic dysfunction (impaired relaxation). Right Ventricle: The right ventricular size is normal. Right vetricular wall thickness was not assessed. Right ventricular systolic function is normal. Left Atrium: Left atrial size was normal in size. Right Atrium: Right atrial size was mildly dilated. Pericardium: There is no evidence of pericardial effusion. Mitral Valve: The mitral valve is grossly normal. No evidence of mitral valve regurgitation. Tricuspid Valve: The tricuspid valve is normal in structure. Tricuspid valve regurgitation is trivial. Aortic Valve: The aortic valve is normal in structure. Aortic valve regurgitation is not visualized. Pulmonic Valve: The pulmonic valve was normal in structure. Pulmonic valve regurgitation is not visualized. Aorta: The aortic root is normal in size and structure. IAS/Shunts: No atrial level shunt detected by color flow Doppler.  LEFT VENTRICLE PLAX 2D LVIDd:         4.80 cm  Diastology LVIDs:         3.30 cm  LV e' lateral:   11.30 cm/s LV PW:         1.00 cm  LV E/e' lateral: 6.2 LV IVS:        0.90 cm  LV e' medial:    7.29 cm/s LVOT diam:     2.20 cm  LV E/e' medial:  9.6 LV SV:         69 LV SV Index:   30 LVOT Area:     3.80 cm  RIGHT VENTRICLE RV S prime:     12.50 cm/s TAPSE (M-mode): 2.2 cm LEFT ATRIUM             Index       RIGHT ATRIUM           Index LA diam:        3.70 cm 1.63 cm/m  RA Area:      20.20 cm  LA Vol (A2C):   55.1 ml 24.22 ml/m RA Volume:   61.00 ml  26.81 ml/m LA Vol (A4C):   54.0 ml 23.74 ml/m LA Biplane Vol: 55.8 ml 24.53 ml/m  AORTIC VALVE LVOT Vmax:   84.20 cm/s LVOT Vmean:  56.200 cm/s LVOT VTI:    0.182 m  AORTA Ao Root diam: 3.80 cm MITRAL VALVE MV Area (PHT): 3.65 cm    SHUNTS MV Decel Time: 208 msec    Systemic VTI:  0.18 m MV E velocity: 70.30 cm/s  Systemic Diam: 2.20 cm MV A velocity: 73.70 cm/s MV E/A ratio:  0.95 Dietrich Pates MD Electronically signed by Dietrich Pates MD Signature Date/Time: 02/25/2020/11:15:36 AM    Final       Subjective: Patient seen and examined at the bedside this morning.  Hemodynamically stable for discharge today.  Discharge Exam: Vitals:   02/26/20 1153 02/26/20 1155  BP: (!) 145/102 (!) 152/106  Pulse:    Resp:    Temp:    SpO2:     Vitals:   02/26/20 1145 02/26/20 1151 02/26/20 1153 02/26/20 1155  BP: (!) 142/104 (!) 146/96 (!) 145/102 (!) 152/106  Pulse:      Resp:      Temp:      TempSrc:      SpO2:      Weight:      Height:        General: Pt is alert, awake, not in acute distress Cardiovascular: RRR, S1/S2 +, no rubs, no gallops Respiratory: CTA bilaterally, no wheezing, no rhonchi Abdominal: Soft, NT, ND, bowel sounds + Extremities: no edema, no cyanosis    The results of significant diagnostics from this hospitalization (including imaging, microbiology, ancillary and laboratory) are listed below for reference.     Microbiology: Recent Results (from the past 240 hour(s))  SARS Coronavirus 2 by RT PCR (hospital order, performed in [facility] hospital lab) Nasopharyngeal Nasopharyngeal Swab     Status: None   Collection Time: 02/24/20  5:48 PM   Specimen: Nasopharyngeal Swab  Result Value Ref Range Status   SARS Coronavirus 2 NEGATIVE NEGATIVE Final    Comment: (NOTE) SARS-CoV-2 target nucleic acids are NOT DETECTED.  The SARS-CoV-2 RNA is generally detectable in upper and lower respiratory specimens  during the acute phase of infection. The lowest concentration of SARS-CoV-2 viral copies this assay can detect is 250 copies / mL. A negative result does not preclude SARS-CoV-2 infection and should not be used as the sole basis for treatment or other patient management decisions.  A negative result may occur with improper specimen collection / handling, submission of specimen other than nasopharyngeal swab, presence of viral mutation(s) within the areas targeted by this assay, and inadequate number of viral copies (<250 copies / mL). A negative result must be combined with clinical observations, patient history, and epidemiological information.  Fact Sheet for Patients:   BoilerBrush.com.cy  Fact Sheet for Healthcare Providers: https://pope.com/  This test is not yet approved or  cleared by the [place] FDA and has been authorized for detection and/or diagnosis of SARS-CoV-2 by FDA under an Emergency Use Authorization (EUA).  This EUA will remain in effect (meaning this test can be used) for the duration of the COVID-19 declaration under Section 564(b)(1) of the Act, 21 U.S.C. section 360bbb-3(b)(1), unless the authorization is terminated or revoked sooner.  Performed at [facility], [address], [place], [place] [address]      Labs: BNP (last 3 results)  No results for input(s): BNP in the last 8760 hours. Basic Metabolic Panel: Recent Labs  Lab 02/24/20 1650 02/25/20 0947 02/26/20 0554  NA 132* 136 137  K 3.2* 4.7 4.4  CL 97* 103 102  CO2 GLUCOSE 105* 117* 109*  BUN CREATININE 1.17 0.91 0.98  CALCIUM 8.9 8.8* 9.3   Liver Function Tests: Recent Labs  Lab 02/24/20 1650  AST 25  ALT 19  ALKPHOS 182*  BILITOT 0.8  PROT 7.6  ALBUMIN 3.8   No results for input(s): LIPASE, AMYLASE in the last 168 hours. No results for input(s): AMMONIA in the last 168 hours. CBC: Recent Labs  Lab  02/24/20 1646 02/25/20 0225 02/26/20 0554  WBC 14.2* 9.0 6.2  HGB 14.6 13.9 14.1  HCT 43.6 43.5 43.8  MCV 91.0 92.8 91.6  PLT 238 203 200   Cardiac Enzymes: No results for input(s): CKTOTAL, CKMB, CKMBINDEX, TROPONINI in the last 168 hours. BNP: Invalid input(s): POCBNP CBG: Recent Labs  Lab 02/24/20 1800  GLUCAP 104*   D-Dimer No results for input(s): DDIMER in the last 72 hours. Hgb A1c No results for input(s): HGBA1C in the last 72 hours. Lipid Profile Recent Labs    02/24/20 1655  CHOL 192  HDL 24*  LDLCALC 142*  TRIG 130  CHOLHDL 8.0   Thyroid function studies No results for input(s): TSH, T4TOTAL, T3FREE, THYROIDAB in the last 72 hours.  Invalid input(s): FREET3 Anemia work up No results for input(s): VITAMINB12, FOLATE, FERRITIN, TIBC, IRON, RETICCTPCT in the last 72 hours. Urinalysis    Component Value Date/Time   COLORURINE YELLOW 08/30/2019 1105   APPEARANCEUR CLEAR 08/30/2019 1105   LABSPEC 1.003 (L) 08/30/2019 1105   PHURINE 6.0 08/30/2019 1105   GLUCOSEU NEGATIVE 08/30/2019 1105   HGBUR MODERATE (A) 08/30/2019 1105   BILIRUBINUR NEGATIVE 08/30/2019 1105   KETONESUR NEGATIVE 08/30/2019 1105   PROTEINUR NEGATIVE 08/30/2019 1105   UROBILINOGEN 1.0 02/28/2015 1555   NITRITE NEGATIVE 08/30/2019 1105   LEUKOCYTESUR NEGATIVE 08/30/2019 1105   Sepsis Labs Invalid input(s): PROCALCITONIN,  WBC,  LACTICIDVEN Microbiology Recent Results (from the past 240 hour(s))  SARS Coronavirus 2 by RT PCR (hospital order, performed in [facility] hospital lab) Nasopharyngeal Nasopharyngeal Swab     Status: None   Collection Time: 02/24/20  5:48 PM   Specimen: Nasopharyngeal Swab  Result Value Ref Range Status   SARS Coronavirus 2 NEGATIVE NEGATIVE Final    Comment: (NOTE) SARS-CoV-2 target nucleic acids are NOT DETECTED.  The SARS-CoV-2 RNA is generally detectable in upper and lower respiratory specimens during the acute phase of infection. The  lowest concentration of SARS-CoV-2 viral copies this assay can detect is 250 copies / mL. A negative result does not preclude SARS-CoV-2 infection and should not be used as the sole basis for treatment or other patient management decisions.  A negative result may occur with improper specimen collection / handling, submission of specimen other than nasopharyngeal swab, presence of viral mutation(s) within the areas targeted by this assay, and inadequate number of viral copies (<250 copies / mL). A negative result must be combined with clinical observations, patient history, and epidemiological information.  Fact Sheet for Patients:   BoilerBrush.com.cy  Fact Sheet for Healthcare Providers: https://pope.com/  This test is not yet approved or  cleared by the [place] FDA and has been authorized for detection and/or diagnosis of SARS-CoV-2 by FDA under an Emergency Use Authorization (EUA).  This EUA  will remain in effect (meaning this test can be used) for the duration of the COVID-19 declaration under Section 564(b)(1) of the Act, 21 U.S.C. section 360bbb-3(b)(1), unless the authorization is terminated or revoked sooner.  Performed at [facility], [address] [place], [place] [id number]     Please note: You were cared for by a hospitalist during your hospital stay. Once you are discharged, your primary care physician will handle any further medical issues. Please note that NO REFILLS for any discharge medications will be authorized once you are discharged, as it is imperative that you return to your primary care physician (or establish a relationship with a primary care physician if you do not have one) for your post hospital discharge needs so that they can reassess your need for medications and monitor your lab values.    Time coordinating discharge: 40 minutes  SIGNED:   Burnadette PopAmrit Nadya Hopwood, MD  [facility] 02/26/2020,  3:49 PM Pager [phone]  If 7PM-7AM, please contact night-coverage [email or web address] Password [email or web address]

## 2020-02-26 NOTE — Progress Notes (Signed)
ANTICOAGULATION CONSULT NOTE  Pharmacy Consult for heparin Indication: chest pain/ACS  Allergies  Allergen Reactions  . Nsaids Other (See Comments)    Internal bleeding   . Cymbalta [Duloxetine Hcl] Other (See Comments)    Blood sugar "bottomed out"    Patient Measurements: Height: 5\' 9"  (175.3 cm) Weight: 110.3 kg (243 lb 3.2 oz) IBW/kg (Calculated) : 70.7  HEPARIN DW (KG): 96.4   Vital Signs: Temp: 97.8 F (36.6 C) (07/18 0538) Temp Source: Oral (07/18 0538) BP: 134/97 (07/18 0538) Pulse Rate: 72 (07/18 0538)  Labs: Recent Labs    02/24/20 1646 02/24/20 1646 02/24/20 1650 02/24/20 1858 02/25/20 0225 02/25/20 0947 02/26/20 0554  HGB 14.6   < >  --   --  13.9  --  14.1  HCT 43.6  --   --   --  43.5  --  43.8  PLT 238  --   --   --  203  --  200  HEPARINUNFRC  --   --   --   --  0.14* 0.30 0.26*  CREATININE  --   --  1.17  --   --  0.91 0.98  TROPONINIHS 45*  --   --  45*  --  11  --    < > = values in this interval not displayed.    Estimated Creatinine Clearance: 105.4 mL/min (by C-G formula based on SCr of 0.98 mg/dL).   Assessment: 54 yo male presented with chest pain. Due to elevated initial HS troponin of 45 and chest pain, ACS is being ruled out. Cardiology states symptoms are concerning for possible unstable angina.The patient was not on anticoagulation PTA. Pharmacy has been consulted for IV heparin dosing.  Heparin level down to slightly subtherapeutic (0.26) on gtt at 1300 units/hr. No issues with line or bleeding reported per RN.  Goal of Therapy:  Heparin level 0.3-0.7 units/ml Monitor platelets by anticoagulation protocol: Yes  Plan:  Increase heparin to 1500 units/hr Will f/u 6 hr heparin level  57, PharmD, BCPS Please see amion for complete clinical pharmacist phone list 02/26/2020 6:32 AM

## 2020-02-26 NOTE — Progress Notes (Signed)
Progress Note  Patient Name: Jerome Camacho Date of Encounter: 02/26/2020  [facility] Cardiologist: New  Subjective   No complaints  Inpatient Medications    Scheduled Meds: . aspirin EC  81 mg Oral Daily  . atorvastatin  80 mg Oral Daily  . buprenorphine-naloxone  1 tablet Sublingual Q6H  . cloNIDine  0.1 mg Oral TID  . escitalopram  20 mg Oral Daily  . gabapentin  900 mg Oral TID  . nicotine  21 mg Transdermal Daily  . tamsulosin  0.8 mg Oral QPC supper   Continuous Infusions: . heparin 1,500 Units/hr (02/26/20 2585)   PRN Meds: acetaminophen, nitroGLYCERIN, ondansetron (ZOFRAN) IV   Vital Signs    Vitals:   02/25/20 1201 02/25/20 1641 02/25/20 1955 02/26/20 0538  BP: 114/79 106/64 (!) 135/97 (!) 134/97  Pulse: 71 74 66 72  Resp: 18 18 16 20   Temp: 98.2 F (36.8 C) 98.1 F (36.7 C) 98.2 F (36.8 C) 97.8 F (36.6 C)  TempSrc: Oral Oral Oral Oral  SpO2: 94% 98% 98% 97%  Weight:    110.3 kg  Height:        Intake/Output Summary (Last 24 hours) at 02/26/2020 0718 Last data filed at 02/25/2020 2200 Gross per 24 hour  Intake 480 ml  Output --  Net 480 ml   Last 3 Weights 02/26/2020 02/25/2020 02/24/2020  Weight (lbs) 243 lb 3.2 oz 250 lb 14.4 oz 254 lb  Weight (kg) 110.315 kg 113.807 kg 115.214 kg  Some encounter information is confidential and restricted. Go to Review Flowsheets activity to see all data.      Telemetry    SR- Personally Reviewed  ECG    n/a - Personally Reviewed  Physical Exam   GEN: No acute distress.   Neck: No JVD Cardiac: RRR, no murmurs, rubs, or gallops.  Respiratory: Clear to auscultation bilaterally. GI: Soft, nontender, non-distended  MS: No edema; No deformity. Neuro:  Nonfocal  Psych: Normal affect   Labs    High Sensitivity Troponin:   Recent Labs  Lab 02/24/20 1646 02/24/20 1858 02/25/20 0947  TROPONINIHS 45* 45* 11      Chemistry Recent Labs  Lab 02/24/20 1650 02/25/20 0947 02/26/20 0554   NA 132* 136 137  K 3.2* 4.7 4.4  CL 97* 103 102  CO2 22 23 27   GLUCOSE 105* 117* 109*  BUN 8 6 7   CREATININE 1.17 0.91 0.98  CALCIUM 8.9 8.8* 9.3  PROT 7.6  --   --   ALBUMIN 3.8  --   --   AST 25  --   --   ALT 19  --   --   ALKPHOS 182*  --   --   BILITOT 0.8  --   --   GFRNONAA >60 >60 >60  GFRAA >60 >60 >60  ANIONGAP 13 10 8      Hematology Recent Labs  Lab 02/24/20 1646 02/25/20 0225 02/26/20 0554  WBC 14.2* 9.0 6.2  RBC 4.79 4.69 4.78  HGB 14.6 13.9 14.1  HCT 43.6 43.5 43.8  MCV 91.0 92.8 91.6  MCH 30.5 29.6 29.5  MCHC 33.5 32.0 32.2  RDW 14.0 14.1 14.1  PLT 238 203 200    BNPNo results for input(s): BNP, PROBNP in the last 168 hours.   DDimer No results for input(s): DDIMER in the last 168 hours.   Radiology    DG Chest 2 View  Result Date: 02/24/2020 CLINICAL DATA:  Chest pain EXAM:  CHEST - 2 VIEW COMPARISON:  None. FINDINGS: The lungs are well expanded and are symmetric. Mild right basilar scarring. The lungs are otherwise clear. No pneumothorax or pleural effusion. Cardiac size within normal limits. The pulmonary vascularity is normal. No acute bone abnormality. IMPRESSION: No active cardiopulmonary disease. Electronically Signed   By: Helyn Numbers MD   On: 02/24/2020 17:40   ECHOCARDIOGRAM COMPLETE  Result Date: 02/25/2020    ECHOCARDIOGRAM REPORT   Patient Name:   Jerome Camacho Date of Exam: 02/25/2020 Medical Rec #:  [id number]         Height:       69.0 in Accession #:    [id number]        Weight:       250.9 lb Date of Birth:  03/24/1966         BSA:          2.275 m Patient Age:    54 years          BP:           106/66 mmHg Patient Gender: M                 HR:           74 bpm. Exam Location:  Inpatient Procedure: 2D Echo and Strain Analysis Indications:    Chest Pain 786.50 / R07.9  History:        Patient has no prior history of Echocardiogram examinations.                 Risk Factors:Current Smoker.  Sonographer:    Leta Jungling RDCS  Referring Phys: [id number] Dorothe Pea Alyissa Whidbee IMPRESSIONS  1. Global longitudinal strain is -21%.. Left ventricular ejection fraction, by estimation, is 60 to 65%. The left ventricle has normal function. The left ventricle has no regional wall motion abnormalities. Left ventricular diastolic parameters are consistent with Grade I diastolic dysfunction (impaired relaxation).  2. Right ventricular systolic function is normal. The right ventricular size is normal.  3. Right atrial size was mildly dilated.  4. The mitral valve is grossly normal. No evidence of mitral valve regurgitation.  5. The aortic valve is normal in structure. Aortic valve regurgitation is not visualized. FINDINGS  Left Ventricle: Global longitudinal strain is -21%. Left ventricular ejection fraction, by estimation, is 60 to 65%. The left ventricle has normal function. The left ventricle has no regional wall motion abnormalities. The left ventricular internal cavity size was normal in size. There is no left ventricular hypertrophy. Left ventricular diastolic parameters are consistent with Grade I diastolic dysfunction (impaired relaxation). Right Ventricle: The right ventricular size is normal. Right vetricular wall thickness was not assessed. Right ventricular systolic function is normal. Left Atrium: Left atrial size was normal in size. Right Atrium: Right atrial size was mildly dilated. Pericardium: There is no evidence of pericardial effusion. Mitral Valve: The mitral valve is grossly normal. No evidence of mitral valve regurgitation. Tricuspid Valve: The tricuspid valve is normal in structure. Tricuspid valve regurgitation is trivial. Aortic Valve: The aortic valve is normal in structure. Aortic valve regurgitation is not visualized. Pulmonic Valve: The pulmonic valve was normal in structure. Pulmonic valve regurgitation is not visualized. Aorta: The aortic root is normal in size and structure. IAS/Shunts: No atrial level shunt detected by color  flow Doppler.  LEFT VENTRICLE PLAX 2D LVIDd:         4.80 cm  Diastology LVIDs:  3.30 cm  LV e' lateral:   11.30 cm/s LV PW:         1.00 cm  LV E/e' lateral: 6.2 LV IVS:        0.90 cm  LV e' medial:    7.29 cm/s LVOT diam:     2.20 cm  LV E/e' medial:  9.6 LV SV:         69 LV SV Index:   30 LVOT Area:     3.80 cm  RIGHT VENTRICLE RV S prime:     12.50 cm/s TAPSE (M-mode): 2.2 cm LEFT ATRIUM             Index       RIGHT ATRIUM           Index LA diam:        3.70 cm 1.63 cm/m  RA Area:     20.20 cm LA Vol (A2C):   55.1 ml 24.22 ml/m RA Volume:   61.00 ml  26.81 ml/m LA Vol (A4C):   54.0 ml 23.74 ml/m LA Biplane Vol: 55.8 ml 24.53 ml/m  AORTIC VALVE LVOT Vmax:   84.20 cm/s LVOT Vmean:  56.200 cm/s LVOT VTI:    0.182 m  AORTA Ao Root diam: 3.80 cm MITRAL VALVE MV Area (PHT): 3.65 cm    SHUNTS MV Decel Time: 208 msec    Systemic VTI:  0.18 m MV E velocity: 70.30 cm/s  Systemic Diam: 2.20 cm MV A velocity: 73.70 cm/s MV E/A ratio:  0.95 Dietrich Pates MD Electronically signed by Dietrich Pates MD Signature Date/Time: 02/25/2020/11:15:36 AM    Final     Cardiac Studies     Patient Profile     Jerome Camacho is a 54 y.o. male with a hx of tobacco abuse who is being seen today for the evaluation of chest pain at the request of Dr .Thomes Dinning  Assessment & Plan    1. Chest pain - presented with chest pain, radiating into jaw. Isolated episode prior to admission, no recurrence.  - CAD risk factors: tobacco, HL, father CABG mid 4s - mild flat trop 45 x 2 then resolved. EKG SR, no acute ischemic changes - echo LVEF 60-65%, no WMAs grade I DDx - medical therapy with ASA 81, atorva 80, hep gtt  - plan for lexiscan today to further evaluate.   For questions or updates, please contact [facility] Please consult [email or web address] for contact info under        Signed, Dina Rich, MD  02/26/2020, 7:18 AM

## 2020-02-27 MED FILL — Ondansetron HCl Inj 4 MG/2ML (2 MG/ML): INTRAMUSCULAR | Qty: 2 | Status: AC

## 2020-03-01 MED FILL — Ondansetron HCl Inj 4 MG/2ML (2 MG/ML): INTRAMUSCULAR | Qty: 2 | Status: AC

## 2020-06-11 ENCOUNTER — Emergency Department (HOSPITAL_COMMUNITY): Payer: Medicare Other

## 2020-06-11 ENCOUNTER — Other Ambulatory Visit: Payer: Self-pay

## 2020-06-11 ENCOUNTER — Encounter (HOSPITAL_COMMUNITY): Payer: Self-pay | Admitting: *Deleted

## 2020-06-11 ENCOUNTER — Emergency Department (HOSPITAL_COMMUNITY)
Admission: EM | Admit: 2020-06-11 | Discharge: 2020-06-11 | Disposition: A | Discharge status: Home / Self Care | Payer: Medicare Other | Attending: Emergency Medicine | Admitting: Emergency Medicine

## 2020-06-11 DIAGNOSIS — Z7982 Long term (current) use of aspirin: Secondary | ICD-10-CM | POA: Diagnosis not present

## 2020-06-11 DIAGNOSIS — D72829 Elevated white blood cell count, unspecified: Secondary | ICD-10-CM | POA: Insufficient documentation

## 2020-06-11 DIAGNOSIS — Z79899 Other long term (current) drug therapy: Secondary | ICD-10-CM | POA: Insufficient documentation

## 2020-06-11 DIAGNOSIS — N2889 Other specified disorders of kidney and ureter: Secondary | ICD-10-CM | POA: Diagnosis not present

## 2020-06-11 DIAGNOSIS — R109 Unspecified abdominal pain: Secondary | ICD-10-CM | POA: Diagnosis present

## 2020-06-11 DIAGNOSIS — F17210 Nicotine dependence, cigarettes, uncomplicated: Secondary | ICD-10-CM | POA: Diagnosis not present

## 2020-06-11 DIAGNOSIS — I10 Essential (primary) hypertension: Secondary | ICD-10-CM | POA: Diagnosis not present

## 2020-06-11 LAB — BASIC METABOLIC PANEL
Anion gap: 7 (ref 5–15)
BUN: 9 mg/dL (ref 6–20)
CO2: 27 mmol/L (ref 22–32)
Calcium: 8.5 mg/dL — ABNORMAL LOW (ref 8.9–10.3)
Chloride: 99 mmol/L (ref 98–111)
Creatinine, Ser: 0.88 mg/dL (ref 0.61–1.24)
GFR, Estimated: >60 mL/min (ref >60)
Glucose, Bld: 89 mg/dL (ref 70–99)
Potassium: 3.8 mmol/L (ref 3.5–5.1)
Sodium: 133 mmol/L — ABNORMAL LOW (ref 135–145)

## 2020-06-11 LAB — URINALYSIS, ROUTINE W REFLEX MICROSCOPIC
Bilirubin Urine: NEGATIVE
Glucose, UA: NEGATIVE mg/dL
Ketones, ur: NEGATIVE mg/dL
Nitrite: NEGATIVE
Protein, ur: NEGATIVE mg/dL
Specific Gravity, Urine: 1.005 (ref 1.005–1.030)
pH: 6 (ref 5.0–8.0)

## 2020-06-11 LAB — CBC
HCT: 42.9 % (ref 39.0–52.0)
Hemoglobin: 14 g/dL (ref 13.0–17.0)
MCH: 30.4 pg (ref 26.0–34.0)
MCHC: 32.6 g/dL (ref 30.0–36.0)
MCV: 93.1 fL (ref 80.0–100.0)
Platelets: 267 10*3/uL (ref 150–400)
RBC: 4.61 MIL/uL (ref 4.22–5.81)
RDW: 14.6 % (ref 11.5–15.5)
WBC: 12.2 10*3/uL — ABNORMAL HIGH (ref 4.0–10.5)
nRBC: 0 % (ref 0.0–0.2)

## 2020-06-11 MED ORDER — ACETAMINOPHEN 500 MG PO TABS
1000.0000 mg | ORAL_TABLET | Freq: Once | ORAL | Status: AC
  Administered 2020-06-11: 1000 mg via ORAL
  Filled 2020-06-11: qty 2

## 2020-06-11 MED ORDER — METHOCARBAMOL 500 MG PO TABS
500.0000 mg | ORAL_TABLET | Freq: Two times a day (BID) | ORAL | 0 refills | Status: DC
Start: 2020-06-11 — End: 2022-09-23

## 2020-06-11 MED ORDER — HYDROMORPHONE HCL 1 MG/ML IJ SOLN
0.5000 mg | Freq: Once | INTRAMUSCULAR | Status: DC
  Filled 2020-06-11: qty 1

## 2020-06-11 MED ORDER — DEXAMETHASONE SODIUM PHOSPHATE 10 MG/ML IJ SOLN
10.0000 mg | Freq: Once | INTRAMUSCULAR | Status: AC
  Administered 2020-06-11: 10 mg via INTRAMUSCULAR
  Filled 2020-06-11: qty 1

## 2020-06-11 NOTE — ED Provider Notes (Signed)
[facility] EMERGENCY DEPARTMENT Provider Note   CSN: [id number] Arrival date & time: 06/11/20  1312     History Chief Complaint  Patient presents with  . Flank Pain    Jerome Camacho is a 54 y.o. male past history of IV drug use, hypertension, hyperlipidemia who presents for evaluation of right flank pain.  He states he has intermittently had pain over the last several months but states that it will get better.  He states that this most recent episode started about 3 to 4 days ago.  He states it is in the right side side.  It does not radiate to the right abdomen.  He states is worse with movement.  He has not taken any medication at home for the pain.  He states it does not go into his abdomen at all.  He is not any dysuria or hematuria.  He has not noted any fevers.  He does have a history of lumbar surgery several years ago.  No complications since then.  He denies any numbness/weakness of his arms or legs, urinary bowel incontinence, saddle anesthesia.  He denies any chest pain, difficulty breathing.  He is currently in rehab for drug abuse.  He states he is currently not using any drugs but is on Suboxone.  He reports a history of IV drug use but states he has not used in the last 10  months.  The history is provided by the patient.       Past Medical History:  Diagnosis Date  . IV drug user     Patient Active Problem List   Diagnosis Date Noted  . Chest pain 02/24/2020  . Essential hypertension 02/24/2020  . Hyperlipidemia 02/24/2020  . BPH (benign prostatic hyperplasia) 02/24/2020  . Leukocytosis 02/24/2020  . Hyponatremia 02/24/2020  . Hypokalemia 02/24/2020  . Troponin level elevated 02/24/2020  . Tobacco abuse 02/24/2020  . Substance abuse (HCC) 02/24/2020  . Substance induced mood disorder (HCC) 02/28/2015    Past Surgical History:  Procedure Laterality Date  . APPENDECTOMY    . BACK SURGERY         History reviewed. No pertinent family  history.  Social History   Tobacco Use  . Smoking status: Heavy Tobacco Smoker    Packs/day: 4.00    Types: Cigarettes  . Smokeless tobacco: Never Used  Vaping Use  . Vaping Use: Never used  Substance Use Topics  . Alcohol use: No  . Drug use: Not Currently    Types: IV    Home Medications Prior to Admission medications   Medication Sig Start Date End Date Taking? Authorizing Provider  amLODipine (NORVASC) 5 MG tablet Take 1 tablet (5 mg total) by mouth daily. 02/26/20 03/27/20  Burnadette Pop, MD  aspirin EC 81 MG tablet Take 81 mg by mouth once. Swallow whole.    [provider]  atorvastatin (LIPITOR) 80 MG tablet Take 80 mg by mouth daily. 02/12/20   [provider]  buprenorphine-naloxone (SUBOXONE) 8-2 mg SUBL SL tablet Place 0.5 tablets under the tongue in the morning, at noon, in the evening, and at bedtime. 02/20/20   [provider]  cloNIDine (CATAPRES) 0.1 MG tablet Take 0.1 mg by mouth 3 (three) times daily as needed. 02/14/20   [provider]  cyclobenzaprine (FLEXERIL) 10 MG tablet Take 10 mg by mouth every 8 (eight) hours. 02/19/20   [provider]  escitalopram (LEXAPRO) 20 MG tablet Take 1 tablet by mouth daily. 01/22/20  [provider]  fluticasone (FLONASE) 50 MCG/ACT nasal spray Place 1 spray into both nostrils daily as needed for allergies.  10/14/19   [provider]  gabapentin (NEURONTIN) 300 MG capsule Take 900 mg by mouth 3 (three) times daily.  02/05/20   [provider]  methocarbamol (ROBAXIN) 500 MG tablet Take 1 tablet (500 mg total) by mouth 2 (two) times daily. 06/11/20   Maxwell Caul, PA-C  nicotine (NICODERM CQ - DOSED IN MG/24 HOURS) 21 mg/24hr patch Place 1 patch (21 mg total) onto the skin daily. 02/27/20   Burnadette Pop, MD  nitroGLYCERIN (NITROSTAT) 0.4 MG SL tablet Place 0.4 mg under the tongue every 5 (five) minutes as needed for chest pain.    [provider]   predniSONE (DELTASONE) 20 MG tablet Take 20 mg by mouth daily as needed.  02/06/20   [provider]  tamsulosin (FLOMAX) 0.4 MG CAPS capsule Take 0.8 mg by mouth daily after supper.  02/14/20   [provider]    Allergies    Nsaids and Cymbalta [duloxetine hcl]  Review of Systems   Review of Systems  Constitutional: Negative for fever.  Respiratory: Negative for cough and shortness of breath.   Cardiovascular: Negative for chest pain.  Gastrointestinal: Negative for abdominal pain, nausea and vomiting.  Genitourinary: Positive for flank pain. Negative for dysuria and hematuria.  Neurological: Negative for weakness and numbness.  All other systems reviewed and are negative.   Physical Exam Updated Vital Signs BP 130/84 (BP Location: Left Arm)   Pulse 89   Temp 98.6 F (37 C) (Oral)   Resp 18   Ht 5\' 10"  (1.778 m)   Wt 115.2 kg   SpO2 99%   BMI 36.45 kg/m   Physical Exam Vitals and nursing note reviewed.  Constitutional:      Appearance: Normal appearance. He is well-developed.  HENT:     Head: Normocephalic and atraumatic.  Eyes:     General: Lids are normal.     Conjunctiva/sclera: Conjunctivae normal.     Pupils: Pupils are equal, round, and reactive to light.  Cardiovascular:     Rate and Rhythm: Normal rate and regular rhythm.     Pulses: Normal pulses.     Heart sounds: Normal heart sounds. No murmur heard.  No friction rub. No gallop.   Pulmonary:     Effort: Pulmonary effort is normal.     Breath sounds: Normal breath sounds.     Comments: Lungs clear to auscultation bilaterally.  Symmetric chest rise.  No wheezing, rales, rhonchi. Abdominal:     Palpations: Abdomen is soft. Abdomen is not rigid.     Tenderness: There is no abdominal tenderness. There is no guarding.     Comments: Abdomen is soft, non-distended, non-tender. No rigidity, No guarding. No peritoneal signs.  Musculoskeletal:        General: Normal range of motion.      Cervical back: Full passive range of motion without pain.       Back:     Comments: No midline T or L-spine tenderness.  Diffuse tenderness palpation noted to the right paraspinal muscles of the lower lumbar region that extend over the right flank area.  No overlying warmth, erythema, rash.  Skin:    General: Skin is warm and dry.     Capillary Refill: Capillary refill takes less than 2 seconds.  Neurological:     Mental Status: He is alert and oriented to person,  place, and time.     Comments: Follows commands, Moves all extremities  5/5 strength to BUE and BLE  Sensation intact throughout all major nerve distributions  Psychiatric:        Speech: Speech normal.     ED Results / Procedures / Treatments   Labs (all labs ordered are listed, but only abnormal results are displayed) Labs Reviewed  URINALYSIS, ROUTINE W REFLEX MICROSCOPIC - Abnormal; Notable for the following components:      Result Value   Hgb urine dipstick LARGE (*)    Leukocytes,Ua SMALL (*)    Bacteria, UA MANY (*)    All other components within normal limits  CBC - Abnormal; Notable for the following components:   WBC 12.2 (*)    All other components within normal limits  BASIC METABOLIC PANEL - Abnormal; Notable for the following components:   Sodium 133 (*)    Calcium 8.5 (*)    All other components within normal limits  URINE CULTURE    EKG None  Radiology CT Renal Stone Study  Result Date: 06/11/2020 CLINICAL DATA:  Right flank pain for the past 4 days. EXAM: CT ABDOMEN AND PELVIS WITHOUT CONTRAST TECHNIQUE: Multidetector CT imaging of the abdomen and pelvis was performed following the standard protocol without IV contrast. COMPARISON:  CT abdomen pelvis dated August 30, 2019. FINDINGS: Lower chest: No acute abnormality.  Mild emphysema again noted. Hepatobiliary: Unchanged small simple cyst in the right hepatic lobe. No new focal liver abnormality. The gallbladder is unremarkable. No biliary  dilatation. Pancreas: Mild atrophy. No ductal dilatation or surrounding inflammatory changes. Spleen: Normal in size without focal abnormality. Adrenals/Urinary Tract: Adrenal glands are unremarkable. Subtle 1.3 cm exophytic lesion arising from the posterior aspect of the right kidney lower pole, isodense to renal parenchyma, more conspicuous when compared to prior study. No renal calculi or hydronephrosis. Mild bladder wall thickening may be related to under distension. Stomach/Bowel: Stomach is within normal limits. Prior appendectomy. No evidence of bowel wall thickening, distention, or inflammatory changes. Vascular/Lymphatic: Aortic atherosclerosis. No enlarged abdominal or pelvic lymph nodes. Reproductive: Prostate is unremarkable. Other: No abdominal wall hernia or abnormality. No abdominopelvic ascites. No pneumoperitoneum. Musculoskeletal: No acute or significant osseous findings. Prior L5-S1 interbody fusion. IMPRESSION: 1. No acute intra-abdominal process. No urolithiasis. 2. Subtle 1.3 cm exophytic lesion arising from the posterior aspect of the right kidney lower pole, isodense to renal parenchyma, more conspicuous when compared to prior study. While this could represent a complex cyst, further evaluation with renal ultrasound is recommended to exclude a solid neoplasm. 3. Aortic Atherosclerosis (ICD10-I70.0) and Emphysema (ICD10-J43.9). Electronically Signed   By: Obie Dredge M.D.   On: 06/11/2020 19:50    Procedures Procedures (including critical care time)  Medications Ordered in ED Medications  dexamethasone (DECADRON) injection 10 mg (has no administration in time range)  acetaminophen (TYLENOL) tablet 1,000 mg (1,000 mg Oral Given 06/11/20 1954)    ED Course  I have reviewed the triage vital signs and the nursing notes.  Pertinent labs & imaging results that were available during my care of the patient were reviewed by me and considered in my medical decision making (see chart  for details).    MDM Rules/Calculators/A&P                          54 year old male who presents for evaluation of right flank pain x4 days.  No urinary complaints, fever.  History of intermittent  right flank pain and states that he was told it was muscle spasm.  He has history of lumbar surgery but has been several years.  No urinary or bowel incontinence, saddle anesthesia.  On initial ED arrival, he is afebrile, nontoxic-appearing.  Vital signs are stable.  On exam, he has point tenderness noted to the right paraspinal muscles of lumbar region that extends to side.  No abdominal tenderness.  Normal strength.  No neuro deficits.  Question if this is musculoskeletal etiology.  Also question kidney stone though appears less likely.  History/physical exam not concerning for cauda equina, spinal abscess, dissection.  Plan check labs, urine.  CBC shows slight leukocytosis of 12.2.  BMP shows normal BUN and creatinine.  UA shows some hemoglobin and small amount of leukocytes.  Otherwise unremarkable.  I reviewed his records.  He has had leukocytosis before.  Additionally, he has had hemoglobin in his urine before.  Given his pain as well as hemoglobin urine, will plan for CT renal study to ensure this not a kidney stone.  Patient given analgesics here in ED.  CT renal study shows no acute intra-abdominal process.  No urolithiasis.  He has a subtle 1.3 cm exophytic lesion arising posterior aspect of the right kidney pole.  Seen on previous but looks slightly more conspicuous when compared to prior study.  Could represent a complex cyst but recommends further evaluation with renal ultrasound to ensure this is no other acute abnormality.  Discussed results with patient.  At this time, patient is hemodynamically stable. He is ambulatory in the ED.  I suspect this is most likely muscle spasm.  He has had a history of GI bleed before and does not take NSAIDs.  Will hold off on Toradol.  Will give a shot of  Decadron here in the ED.  We will plan to send him home with Robaxin for supportive care.  Patient instructed to follow-up with his primary care doctor in regards to CT findings today stating obtain an outpatient renal ultrasound.  At this time, no emergent MRI imaging is do not suspect spinal etiology of his symptoms. At this time, patient exhibits no emergent life-threatening condition that require further evaluation in ED. Patient had ample opportunity for questions and discussion. All patient's questions were answered with full understanding. Portions of this note were generated with Scientist, clinical (histocompatibility and immunogenetics)Dragon dictation software. Dictation errors may occur despite best attempts at proofreading.  Portions of this note were generated with Scientist, clinical (histocompatibility and immunogenetics)Dragon dictation software. Dictation errors may occur despite best attempts at proofreading  Final Clinical Impression(s) / ED Diagnoses Final diagnoses:  Flank pain  Renal mass    Rx / DC Orders ED Discharge Orders         Ordered    methocarbamol (ROBAXIN) 500 MG tablet  2 times daily        06/11/20 2028           Rosana HoesLayden, Nadeem Romanoski A, PA-C 06/11/20 2033    Mancel BaleWentz, Elliott, MD 06/12/20 1214

## 2020-06-11 NOTE — ED Triage Notes (Signed)
Right flank pain.

## 2020-06-11 NOTE — Discharge Instructions (Addendum)
Your work-up today was reassuring.  I suspect your symptoms are most likely a muscle spasm.  We will plan to give you muscle relaxers.  Take Robaxin as prescribed. This medication will make you drowsy so do not drive or drink alcohol when taking it.  As we discussed, you have a small area on the right kidney that looks like a complex cyst but needs further evaluation with renal ultrasound to take a better look.  This can be done on an outpatient basis.  Please follow-up with your primary care doctor regarding this.

## 2020-06-13 LAB — URINE CULTURE: Culture: >=100000 — AB

## 2020-06-14 ENCOUNTER — Telehealth: Payer: Self-pay | Admitting: *Deleted

## 2020-06-14 NOTE — Telephone Encounter (Signed)
Post ED Visit - Positive Culture Follow-up  Culture report reviewed by antimicrobial stewardship pharmacist: [facility] []  , Pharm.D. []  Enzo Bi, Pharm.D., BCPS AQ-ID []  , Pharm.D., BCPS []  Celedonio Miyamoto, Pharm.D., BCPS []  Foyil, Garvin Fila.D., BCPS, AAHIVP []  , Pharm.D., BCPS, AAHIVP []  Georgina Pillion, PharmD, BCPS []  , PharmD, BCPS []  [place], PharmD, BCPS []  [address], PharmD []  , PharmD, BCPS []  Estella Husk, PharmD  Pharmacy Team []  Lysle Pearl, PharmD []  , PharmD []  Phillips Climes, PharmD []  , Rph []  Agapito Games) , PharmD []  Verlan Friends, PharmD []  , PharmD []  Mervyn Gay, PharmD []  , PharmD []  Vinnie Level, PharmD []  [facility], PharmD []  , PharmD []  Len Childs, PharmD   Positive urine culture No antibiotics needed and no further patient follow-up is required at this time. , PharmD  Greer Pickerel Talley 06/14/2020, 10:43 AM

## 2020-09-03 ENCOUNTER — Other Ambulatory Visit (HOSPITAL_COMMUNITY): Payer: Self-pay | Admitting: Emergency Medicine

## 2020-09-03 ENCOUNTER — Other Ambulatory Visit: Payer: Self-pay | Admitting: Emergency Medicine

## 2020-09-03 DIAGNOSIS — N281 Cyst of kidney, acquired: Secondary | ICD-10-CM

## 2020-09-14 ENCOUNTER — Ambulatory Visit (HOSPITAL_COMMUNITY)
Admission: RE | Admit: 2020-09-14 | Discharge: 2020-09-14 | Disposition: A | Discharge status: Home / Self Care | Payer: Medicare Other | Source: Ambulatory Visit | Attending: Emergency Medicine | Admitting: Emergency Medicine

## 2020-09-14 ENCOUNTER — Other Ambulatory Visit: Payer: Self-pay

## 2020-09-14 DIAGNOSIS — N281 Cyst of kidney, acquired: Secondary | ICD-10-CM | POA: Diagnosis not present

## 2020-09-21 ENCOUNTER — Other Ambulatory Visit (HOSPITAL_COMMUNITY): Payer: Self-pay | Admitting: Emergency Medicine

## 2020-09-21 DIAGNOSIS — N281 Cyst of kidney, acquired: Secondary | ICD-10-CM

## 2020-09-26 ENCOUNTER — Encounter (INDEPENDENT_AMBULATORY_CARE_PROVIDER_SITE_OTHER): Payer: Self-pay | Admitting: *Deleted

## 2020-10-16 ENCOUNTER — Other Ambulatory Visit: Payer: Self-pay

## 2020-10-16 ENCOUNTER — Ambulatory Visit (HOSPITAL_COMMUNITY)
Admission: RE | Admit: 2020-10-16 | Discharge: 2020-10-16 | Disposition: A | Discharge status: Home / Self Care | Payer: Medicare Other | Source: Ambulatory Visit | Attending: Emergency Medicine | Admitting: Emergency Medicine

## 2020-10-16 DIAGNOSIS — N281 Cyst of kidney, acquired: Secondary | ICD-10-CM | POA: Diagnosis present

## 2020-10-16 LAB — POCT I-STAT CREATININE: Creatinine, Ser: 0.9 mg/dL (ref 0.61–1.24)

## 2020-10-16 MED ORDER — IOHEXOL 300 MG/ML  SOLN: 100.0000 mL | Freq: Once | INTRAMUSCULAR | Status: DC | PRN

## 2020-10-16 MED ORDER — IOHEXOL 300 MG/ML  SOLN
100.0000 mL | Freq: Once | INTRAMUSCULAR | Status: AC | PRN
  Administered 2020-10-16: 100 mL via INTRAVENOUS

## 2020-12-26 ENCOUNTER — Ambulatory Visit (INDEPENDENT_AMBULATORY_CARE_PROVIDER_SITE_OTHER): Payer: Medicare Other | Admitting: Gastroenterology

## 2021-02-21 ENCOUNTER — Ambulatory Visit (INDEPENDENT_AMBULATORY_CARE_PROVIDER_SITE_OTHER): Payer: Medicare Other | Admitting: Gastroenterology

## 2022-07-16 ENCOUNTER — Other Ambulatory Visit (HOSPITAL_COMMUNITY): Payer: Self-pay | Admitting: Emergency Medicine

## 2022-07-16 DIAGNOSIS — B1920 Unspecified viral hepatitis C without hepatic coma: Secondary | ICD-10-CM

## 2022-07-25 ENCOUNTER — Ambulatory Visit (HOSPITAL_COMMUNITY)
Admission: RE | Admit: 2022-07-25 | Discharge: 2022-07-25 | Disposition: A | Discharge status: Home / Self Care | Payer: Medicare Other | Source: Ambulatory Visit | Attending: Emergency Medicine | Admitting: Emergency Medicine

## 2022-07-25 DIAGNOSIS — B1920 Unspecified viral hepatitis C without hepatic coma: Secondary | ICD-10-CM | POA: Insufficient documentation

## 2022-08-01 IMAGING — CT CT ABD-PEL WO/W CM
2 of 12 series · 10 of 46 positions shown, 15 images · IV contrast (Omnipaque or Isovue)
Comparison: CT abdomen pelvis June 11, 2020

CLINICAL DATA: Further evaluation of renal lesions seen on prior
CT.

EXAM:
CT ABDOMEN AND PELVIS WITHOUT AND WITH CONTRAST
TECHNIQUE: Multidetector CT imaging of the abdomen and pelvis was performed
following the standard protocol before and following the bolus
administration of intravenous contrast.
CONTRAST:  100mL OMNIPAQUE IOHEXOL 300 MG/ML  SOLN

[Series 5: coronal pre · coronal · non-contrast · 0.53mm/px · 3 of 108 slices shown]
[im 27/108  soft-tissue]
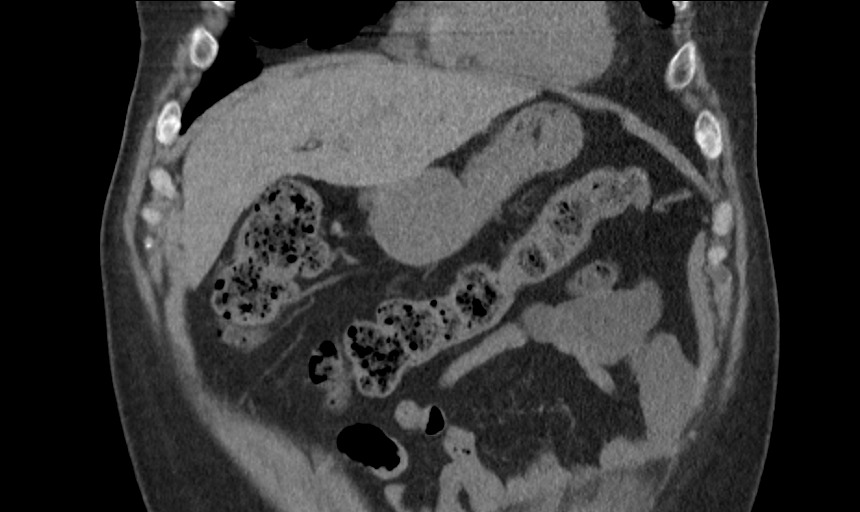
[im 54/108  soft-tissue]
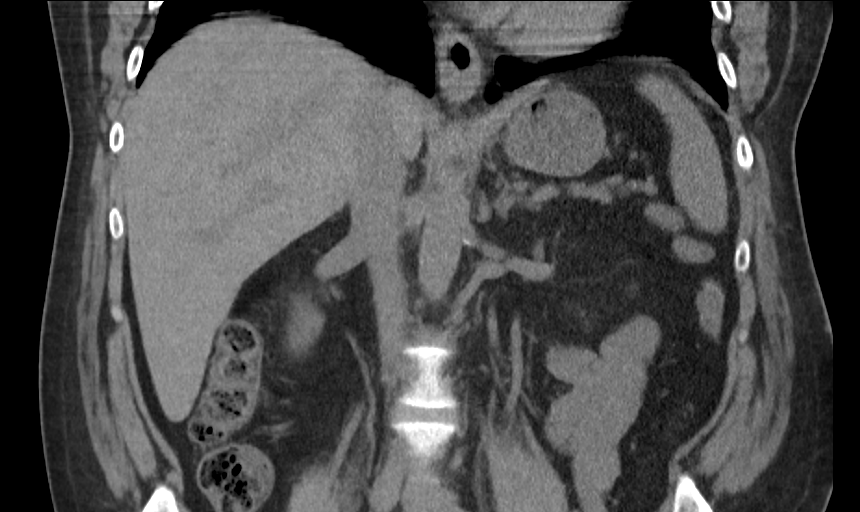
[im 81/108  soft-tissue]
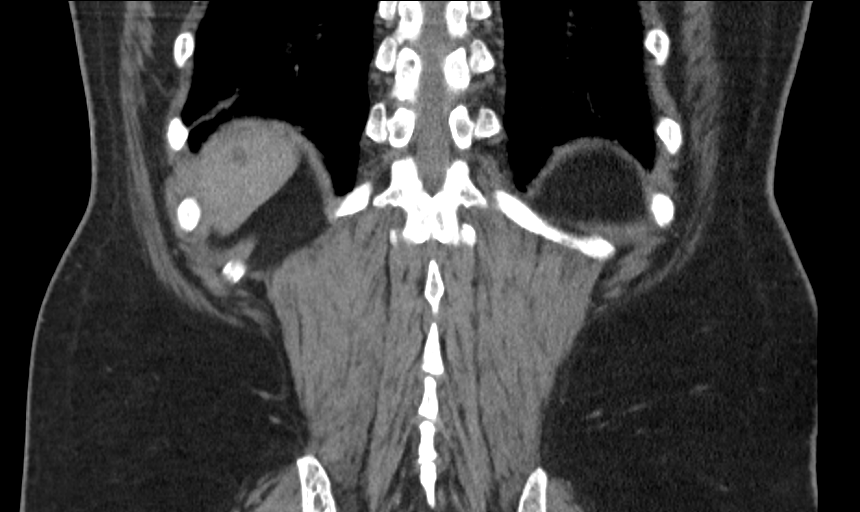

[Series 11: axial nephro · axial · 0.83mm/px · z∈[+708,+1050]mm · 7 of 154 slices shown, 12 images]
[im 20/154  soft-tissue]
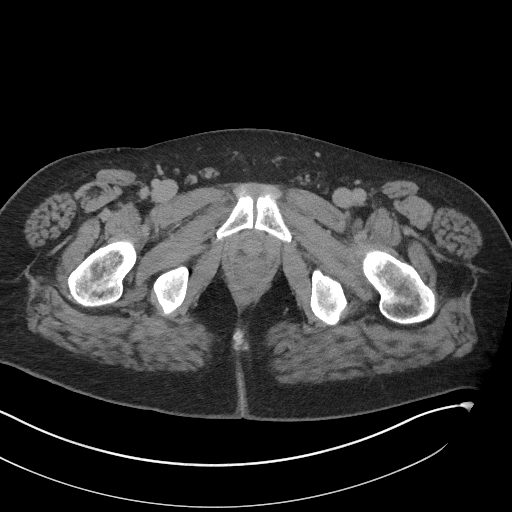
[im 20/154  bone]
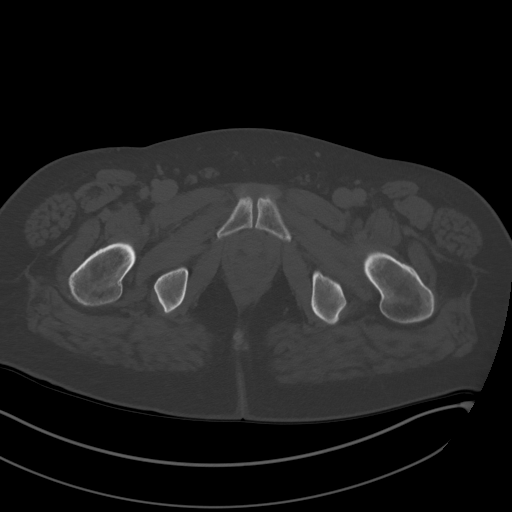
[im 39/154  soft-tissue]
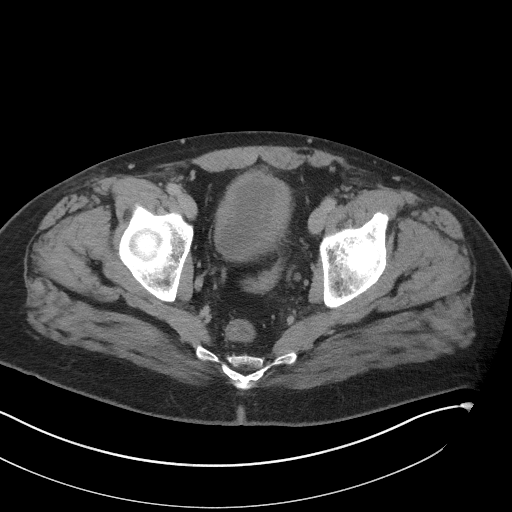
[im 58/154  soft-tissue]
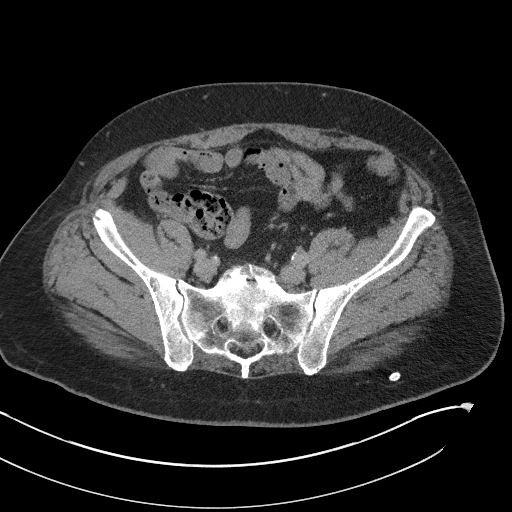
[im 77/154  soft-tissue]
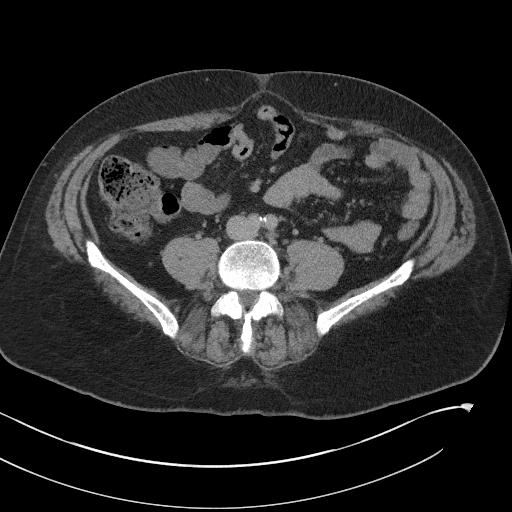
[im 77/154  lung]
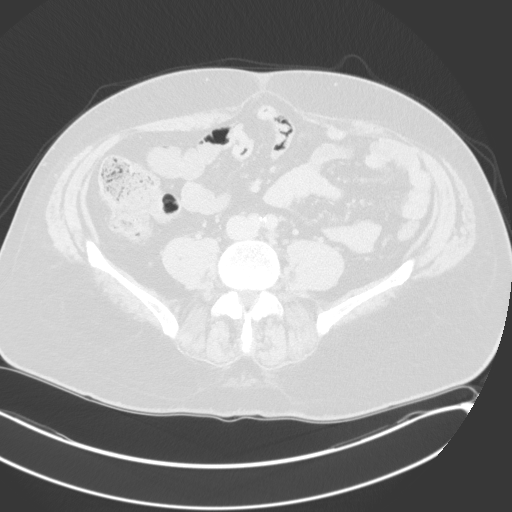
[im 96/154  soft-tissue]
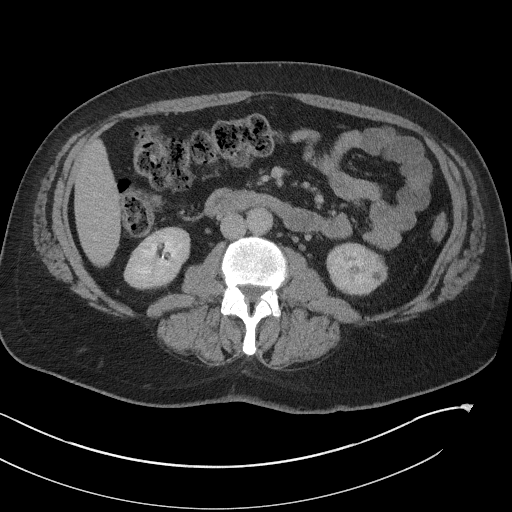
[im 96/154  lung]
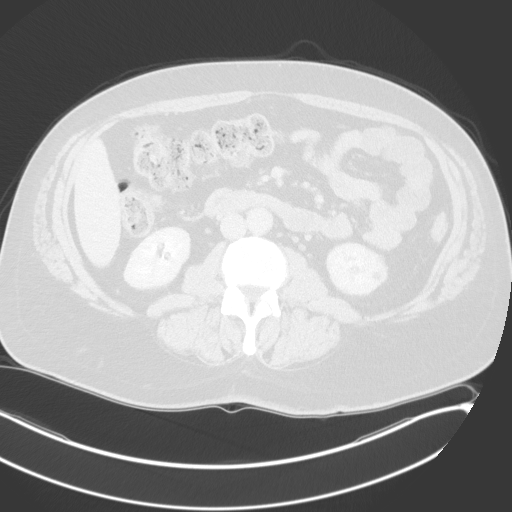
[im 115/154  soft-tissue]
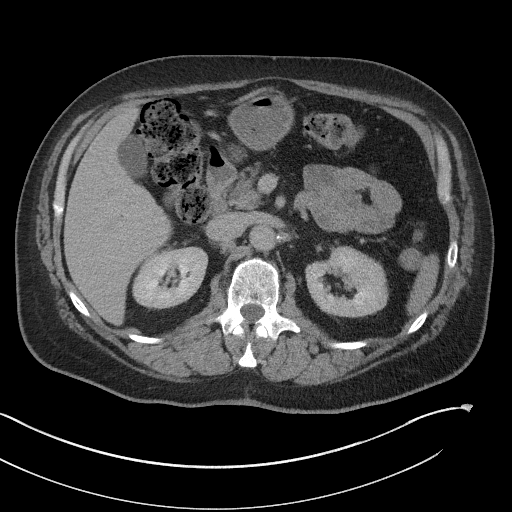
[im 115/154  lung]
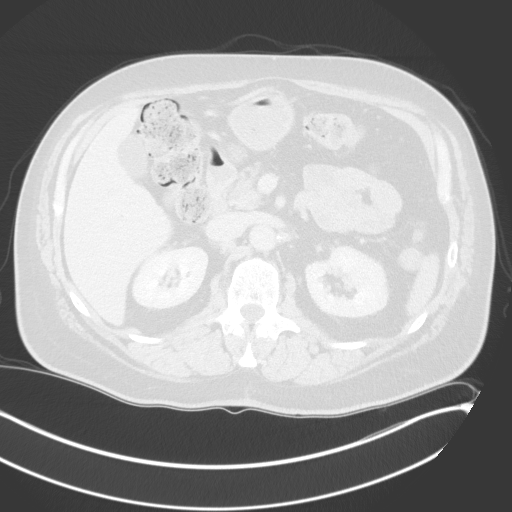
[im 134/154  soft-tissue]
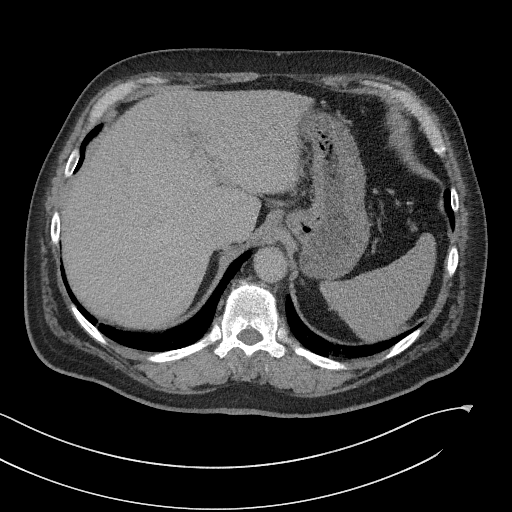
[im 134/154  lung]
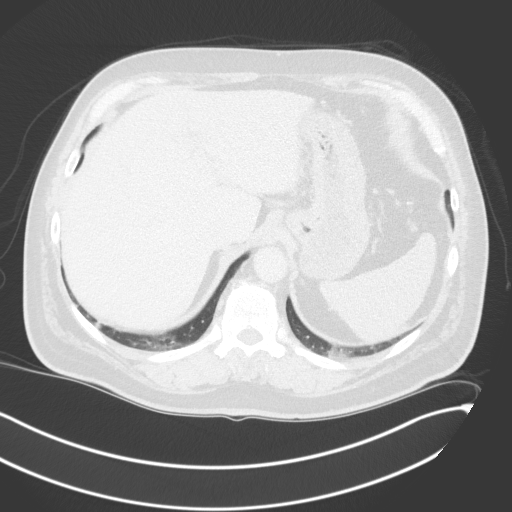

[10 of 46 positions shown; findings below may reference images not displayed]

FINDINGS: Lower chest: Bibasilar atelectasis. Mild emphysematous change.
Normal size heart. No pericardial effusion.

Hepatobiliary: Unchanged hepatic cyst in the right lobe of the liver
measuring 1.5 cm on image [DATE]. No suspicious hepatic lesions.
Gallbladder is unremarkable. No biliary ductal dilation.

Pancreas: Unremarkable.

Spleen: Unremarkable.

Adrenals/Urinary Tract: Bilateral adrenal glands are unremarkable.

No hydronephrosis. Punctate nonobstructive bilateral renal calculi
measuring up to 2/3 mm.

Homogeneous exophytic right interpolar renal lesion which measures
1.3 cm on image 54/11 and is isodense to renal parenchyma on
noncontrast sequence without enhancement on postcontrast imaging,
consistent with a hemorrhagic/proteinaceous renal cyst. There are
multiple other bilateral subcentimeter hypodense renal lesions which
are technically too small to accurately characterize but favored
represent cysts.

No suspicious filling defect visualized within the opacified
portions of the collecting system and ureters on delayed imaging.

Symmetric bladder wall thickening though the urinary bladder is
predominantly decompressed.

Stomach/Bowel: Stomach is within normal limits. Appendix is
surgically absent. No evidence of bowel wall thickening, distention,
or inflammatory changes.

Vascular/Lymphatic: Aortic atherosclerosis. No enlarged abdominal or
pelvic lymph nodes.

Reproductive: Prostate is unremarkable.

Other: No abdominopelvic ascites.

Musculoskeletal: L5-S1 disc spacer.  No acute osseous abnormality.
IMPRESSION: 1. Isodense nonenhancing 1.3 cm right interpolar renal lesion, which
corresponds with lesion seen on prior CT and is consistent with a
hemorrhagic/proteinaceous renal cyst. Multiple other bilateral
subcentimeter hypodense renal lesions which are technically too
small to accurately characterize but favored represent cysts. No
suspicious solid enhancing renal lesions visualized.
2. Punctate nonobstructive bilateral renal calculi.
3. Symmetric bladder wall thickening though the urinary bladder is
predominantly decompressed. Correlate for cystitis.
4. Emphysema and aortic atherosclerosis.

Aortic Atherosclerosis (YX0LW-HCQ.Q) and Emphysema (YX0LW-BI6.O).

## 2022-09-23 ENCOUNTER — Emergency Department (HOSPITAL_COMMUNITY): Payer: Medicare HMO

## 2022-09-23 ENCOUNTER — Emergency Department (HOSPITAL_COMMUNITY)
Admission: EM | Admit: 2022-09-23 | Discharge: 2022-09-23 | Disposition: A | Discharge status: Home / Self Care | Payer: Medicare HMO | Attending: Emergency Medicine | Admitting: Emergency Medicine

## 2022-09-23 ENCOUNTER — Other Ambulatory Visit: Payer: Self-pay

## 2022-09-23 ENCOUNTER — Encounter (HOSPITAL_COMMUNITY): Payer: Self-pay | Admitting: *Deleted

## 2022-09-23 DIAGNOSIS — I10 Essential (primary) hypertension: Secondary | ICD-10-CM | POA: Diagnosis not present

## 2022-09-23 DIAGNOSIS — R600 Localized edema: Secondary | ICD-10-CM | POA: Diagnosis not present

## 2022-09-23 DIAGNOSIS — Z79899 Other long term (current) drug therapy: Secondary | ICD-10-CM | POA: Diagnosis not present

## 2022-09-23 DIAGNOSIS — R079 Chest pain, unspecified: Secondary | ICD-10-CM | POA: Diagnosis present

## 2022-09-23 DIAGNOSIS — R0789 Other chest pain: Secondary | ICD-10-CM

## 2022-09-23 DIAGNOSIS — F17210 Nicotine dependence, cigarettes, uncomplicated: Secondary | ICD-10-CM | POA: Diagnosis not present

## 2022-09-23 DIAGNOSIS — Z7982 Long term (current) use of aspirin: Secondary | ICD-10-CM | POA: Diagnosis not present

## 2022-09-23 DIAGNOSIS — R0602 Shortness of breath: Secondary | ICD-10-CM | POA: Diagnosis not present

## 2022-09-23 HISTORY — DX: Unspecified viral hepatitis C without hepatic coma: B19.20

## 2022-09-23 LAB — BASIC METABOLIC PANEL
Anion gap: 14 (ref 5–15)
BUN: 13 mg/dL (ref 6–20)
CO2: 31 mmol/L (ref 22–32)
Calcium: 8.9 mg/dL (ref 8.9–10.3)
Chloride: 94 mmol/L — ABNORMAL LOW (ref 98–111)
Creatinine, Ser: 1.14 mg/dL (ref 0.61–1.24)
GFR, Estimated: >60 mL/min (ref >60)
Glucose, Bld: 117 mg/dL — ABNORMAL HIGH (ref 70–99)
Potassium: 3.9 mmol/L (ref 3.5–5.1)
Sodium: 139 mmol/L (ref 135–145)

## 2022-09-23 LAB — BRAIN NATRIURETIC PEPTIDE: B Natriuretic Peptide: 28 pg/mL (ref 0.0–100.0)

## 2022-09-23 LAB — CBC
HCT: 47.7 % (ref 39.0–52.0)
Hemoglobin: 15.8 g/dL (ref 13.0–17.0)
MCH: 30.4 pg (ref 26.0–34.0)
MCHC: 33.1 g/dL (ref 30.0–36.0)
MCV: 91.9 fL (ref 80.0–100.0)
Platelets: 344 10*3/uL (ref 150–400)
RBC: 5.19 MIL/uL (ref 4.22–5.81)
RDW: 13.5 % (ref 11.5–15.5)
WBC: 10.9 10*3/uL — ABNORMAL HIGH (ref 4.0–10.5)
nRBC: 0 % (ref 0.0–0.2)

## 2022-09-23 LAB — TROPONIN I (HIGH SENSITIVITY)
Troponin I (High Sensitivity): 5 ng/L (ref <18)
Troponin I (High Sensitivity): 4 ng/L (ref <18)

## 2022-09-23 MED ORDER — FUROSEMIDE 20 MG PO TABS: 20.0000 mg | ORAL_TABLET | Freq: Every day | ORAL | 0 refills | Status: DC

## 2022-09-23 NOTE — Discharge Instructions (Signed)
Your testing tonight has not shown any specific abnormalities.  Your blood work was reassuring.  Your vital signs have been reassuring.  There is no signs of heart failure or heart attack or increasing fluid on your lungs.  I do want you to follow-up with a cardiologist.  Call the phone number in the morning to make a follow-up appointment for this week.  Take furosemide once a day to help with swelling and fluid.  Do not take this any longer than 2 weeks without having your blood work rechecked by your doctor

## 2022-09-23 NOTE — ED Triage Notes (Signed)
Pt has been retaining fluid and feels that he has gained about 20-25lbs in the last month.  Pt feels unwell and has had sob with this, no CP

## 2022-09-23 NOTE — ED Provider Notes (Signed)
[facility] Provider Note   CSN: [id number] Arrival date & time: 09/23/22  1505     History  Chief Complaint  Patient presents with   Shortness of Breath    Jerome Camacho is a 57 y.o. male.   Shortness of Breath  This patient is a 11 old male, he has a history of IV drug use in remission about 3 years now, heavy smoker about 2 packs/day, he is on both clonidine and amlodipine for blood pressure and Lipitor for high cholesterol.  He is not a diabetic.  He had been admitted to the hospital in 2021 with a presumed myocardial infarction after his troponin came back elevated however he had a stress test during that admission and was cleared by cardiology with an essentially normal stress test.  He did have grade 1 dysfunction on his echocardiogram that was also performed during that admission in July 2021.  He reports recently has had an increasing in his weight gain approximately 20 pounds, significant swelling in his legs, improving with some Lasix that he had leftover from a previous prescription though he does not take it every day and rarely takes it.  He also endorses having some dizziness and chest pain which has been increasing and frequently recently.  He denies fevers or chills, he has not been coughing, he has been increasingly short of breath especially orthopneic recently.  Patient is on Suboxone, they are the ones to prescribe the intermittent Lasix for him.    Home Medications Prior to Admission medications   Medication Sig Start Date End Date Taking? Authorizing Provider  acetaminophen (TYLENOL) 500 MG tablet Take 1,500 mg by mouth every 6 (six) hours as needed for moderate pain.   Yes [provider]  amLODipine (NORVASC) 5 MG tablet Take 1 tablet (5 mg total) by mouth daily. 02/26/20 09/23/22 Yes Shelly Coss, MD  aspirin EC 81 MG tablet Take 81 mg by mouth once. Swallow whole.   Yes [provider]   atorvastatin (LIPITOR) 80 MG tablet Take 80 mg by mouth daily. 02/12/20  Yes [provider]  Buprenorphine HCl-Naloxone HCl (SUBOXONE) 8-2 MG FILM Place 0.5 Film under the tongue 4 (four) times daily.   Yes [provider]  carbamazepine (TEGRETOL) 200 MG tablet Take 200 mg by mouth 3 (three) times daily.   Yes [provider]  cloNIDine (CATAPRES) 0.1 MG tablet Take 0.1 mg by mouth 3 (three) times daily as needed. 02/14/20  Yes [provider]  cyclobenzaprine (FLEXERIL) 10 MG tablet Take 10 mg by mouth every 8 (eight) hours. 02/19/20  Yes [provider]  fluticasone (FLONASE) 50 MCG/ACT nasal spray Place 1 spray into both nostrils daily as needed for allergies.  10/14/19  Yes [provider]  furosemide (LASIX) 20 MG tablet Take 1 tablet (20 mg total) by mouth daily for 15 days. 09/23/22 10/08/22 Yes Noemi Chapel, MD  gabapentin (NEURONTIN) 300 MG capsule Take 900 mg by mouth 3 (three) times daily.  02/05/20  Yes [provider]  hydrOXYzine (VISTARIL) 50 MG capsule Take 50 mg by mouth 3 (three) times daily as needed for anxiety. 09/18/22  Yes [provider]  methylphenidate (RITALIN) 20 MG tablet Take 10 mg by mouth daily. 05/15/20  Yes [provider]  naproxen sodium (ALEVE) 220 MG tablet Take 220 mg by mouth.   Yes [provider]  ondansetron (ZOFRAN) 4 MG tablet Take 4 mg by mouth every  8 (eight) hours as needed. 09/02/22  Yes [provider]  QUEtiapine (SEROQUEL) 100 MG tablet Take 100 mg by mouth at bedtime.   Yes [provider]  tamsulosin (FLOMAX) 0.4 MG CAPS capsule Take 0.8 mg by mouth daily after breakfast. 02/14/20  Yes [provider]      Allergies    Nsaids and Cymbalta [duloxetine hcl]    Review of Systems   Review of Systems  Respiratory:  Positive for shortness of breath.   All other systems reviewed and are negative.   Physical Exam Updated Vital Signs BP  122/81   Pulse 79   Temp 98 F (36.7 C) (Oral)   Resp 10   Wt 118.6 kg   SpO2 94%   BMI 37.52 kg/m  Physical Exam Vitals and nursing note reviewed.  Constitutional:      General: He is not in acute distress.    Appearance: He is well-developed.  HENT:     Head: Normocephalic and atraumatic.     Mouth/Throat:     Pharynx: No oropharyngeal exudate.  Eyes:     General: No scleral icterus.       Right eye: No discharge.        Left eye: No discharge.     Conjunctiva/sclera: Conjunctivae normal.     Pupils: Pupils are equal, round, and reactive to light.  Neck:     Thyroid: No thyromegaly.     Vascular: No JVD.  Cardiovascular:     Rate and Rhythm: Normal rate and regular rhythm.     Heart sounds: Normal heart sounds. No murmur heard.    No friction rub. No gallop.  Pulmonary:     Effort: Pulmonary effort is normal. No respiratory distress.     Breath sounds: Normal breath sounds. No wheezing or rales.  Chest:     Chest wall: No tenderness.  Abdominal:     General: Bowel sounds are normal. There is no distension.     Palpations: Abdomen is soft. There is no mass.     Tenderness: There is no abdominal tenderness.  Musculoskeletal:        General: No tenderness. Normal range of motion.     Cervical back: Normal range of motion and neck supple.     Right lower leg: Edema present.     Left lower leg: Edema present.     Comments: 1+ symmetrical pitting edema of the pretibial region  Lymphadenopathy:     Cervical: No cervical adenopathy.  Skin:    General: Skin is warm and dry.     Findings: No erythema or rash.  Neurological:     Mental Status: He is alert.     Coordination: Coordination normal.  Psychiatric:        Behavior: Behavior normal.     ED Results / Procedures / Treatments   Labs (all labs ordered are listed, but only abnormal results are displayed) Labs Reviewed  BASIC METABOLIC PANEL - Abnormal; Notable for the following components:      Result Value    Chloride 94 (*)    Glucose, Bld 117 (*)    All other components within normal limits  CBC - Abnormal; Notable for the following components:   WBC 10.9 (*)    All other components within normal limits  BRAIN NATRIURETIC PEPTIDE  TROPONIN I (HIGH SENSITIVITY)  TROPONIN I (HIGH SENSITIVITY)    EKG EKG Interpretation  Date/Time:  Tuesday September 23 2022 15:28:21 EST Ventricular Rate:  103 PR Interval:  172 QRS Duration: 96 QT Interval:  352 QTC Calculation: 461 R Axis:   52 Text Interpretation: Sinus tachycardia Otherwise normal ECG When compared with ECG of 25-Feb-2020 12:20, Questionable change in QRS axis Confirmed by Noemi Chapel [phone]) on 09/23/2022 3:53:34 PM  Radiology DG Chest 2 View  Result Date: 09/23/2022 CLINICAL DATA:  Swelling and fluid retention. EXAM: CHEST - 2 VIEW COMPARISON:  Chest radiographs 02/24/2020 FINDINGS: The cardiomediastinal silhouette is unchanged with normal heart size. The lungs are well inflated with scarring in the lung bases. No acute airspace consolidation, overt pulmonary edema, pleural effusion, or pneumothorax is identified. No acute osseous abnormality is seen. IMPRESSION: No active cardiopulmonary disease. Electronically Signed   By: Logan Bores M.D.   On: 09/23/2022 15:49    Procedures Procedures    Medications Ordered in ED Medications - No data to display  ED Course/ Medical Decision Making/ A&P                             Medical Decision Making Amount and/or Complexity of Data Reviewed Labs: ordered. Radiology: ordered. ECG/medicine tests: ordered.  Risk Prescription drug management.   This patient presents to the ED for concern of chest pain and shortness of breath, this involves an extensive number of treatment options, and is a complaint that carries with it a high risk of complications and morbidity.  The differential diagnosis includes pneumonia, COPD, chest pain   Co morbidities that complicate the patient  evaluation  Heavy smoker, hypertension, increasing chest pain shortness of breath on exertion   Additional history obtained:  Additional history obtained from electronic medical record External records from outside source obtained and reviewed including prior hospitalizations echocardiogram and nuclear stress test   Lab Tests:  I Ordered, and personally interpreted labs.  The pertinent results include: Labs rather unremarkable, troponin unremarkable   Imaging Studies ordered:  I ordered imaging studies including chest x-ray unremarkable I independently visualized and interpreted imaging which showed no acute findings I agree with the radiologist interpretation   Cardiac Monitoring: / EKG:  The patient was maintained on a cardiac monitor.  I personally viewed and interpreted the cardiac monitored which showed an underlying rhythm of: Normal sinus rhythm, heart rate of 80    Problem List / ED Course / Critical interventions / Medication management  The patient is well-appearing without any acute findings on exam.  He has no signs of congestive heart failure, pulmonary embolism or myocardial infarction.  He will be referred to cardiology as an outpatient.  The patient is agreeable he is symptom-free and will be given 2 weeks of Lasix I ordered medication including furosemide for fluid retention Reevaluation of the patient after these medicines showed that the patient improved I have reviewed the patients home medicines and have made adjustments as needed   Social Determinants of Health:  Prior substance abuse   Test / Admission - Considered:  Considered admission but the patient has unremarkable workup and can follow-up outpatient, he is agreeable         Final Clinical Impression(s) / ED Diagnoses Final diagnoses:  Shortness of breath  Left-sided chest pain  Other chest pain    Rx / DC Orders ED Discharge Orders          Ordered    furosemide (LASIX) 20  MG tablet  Daily        09/23/22 2042    Ambulatory  referral to Cardiology        09/23/22 2043              Noemi Chapel, MD 09/23/22 2044

## 2022-11-12 NOTE — Progress Notes (Deleted)
    Cardiology Office Note  Date: 11/12/2022   ID: Jerome Camacho, DOB 1965-10-14, MRN [id number]  History of Present Illness: Jerome Camacho is a 57 y.o. male referred to the office by Dr. Sabra Heck after ER encounter in February for evaluation of shortness of breath and chest discomfort. High-sensitivity troponin I levels were normal, BNP also normal.  Chest x-ray demonstrated no acute process.  ECG showed sinus tachycardia with no acute ST segment changes.  He rhad eported significant weight gain and was having leg swelling at the time, discharged on Lasix by Dr. Sabra Heck with plan to follow-up with cardiology.  I reviewed the chart, he was actually seen in consultation by Dr. Harl Bowie back in July 2021 for evaluation of chest pain.  No clear evidence of ACS at that time.  Echocardiogram revealed normal LVEF at 60 to 65% without regional wall motion abnormalities and he underwent a Lexiscan Myoview as well demonstrating no ischemia.    Physical Exam: VS:  There were no vitals taken for this visit., BMI There is no height or weight on file to calculate BMI.  Wt Readings from Last 3 Encounters:  09/23/22 261 lb 8 oz (118.6 kg)  06/11/20 254 lb (115.2 kg)  02/26/20 243 lb 3.2 oz (110.3 kg)    General: Patient appears comfortable at rest. HEENT: Conjunctiva and lids normal, oropharynx clear with moist mucosa. Neck: Supple, no elevated JVP or carotid bruits, no thyromegaly. Lungs: Clear to auscultation, nonlabored breathing at rest. Cardiac: Regular rate and rhythm, no S3 or significant systolic murmur, no pericardial rub. Abdomen: Soft, nontender, no hepatomegaly, bowel sounds present, no guarding or rebound. Extremities: No pitting edema, distal pulses 2+. Skin: Warm and dry. Musculoskeletal: No kyphosis. Neuropsychiatric: Alert and oriented x3, affect grossly appropriate.  ECG:  An ECG dated 09/23/2022 was personally reviewed today and demonstrated:  Sinus  tachycardia.  Labwork: 09/23/2022: B Natriuretic Peptide 28.0; BUN 13; Creatinine, Ser 1.14; Hemoglobin 15.8; Platelets 344; Potassium 3.9; Sodium 139     Component Value Date/Time   CHOL 192 02/24/2020 1655   TRIG 130 02/24/2020 1655   HDL 24 (L) 02/24/2020 1655   CHOLHDL 8.0 02/24/2020 1655   VLDL 26 02/24/2020 1655   LDLCALC 142 (H) 02/24/2020 1655   Other Studies Reviewed Today:  Echocardiogram 02/25/2020:  1. Global longitudinal strain is -21%.. Left ventricular ejection  fraction, by estimation, is 60 to 65%. The left ventricle has normal  function. The left ventricle has no regional wall motion abnormalities.  Left ventricular diastolic parameters are  consistent with Grade I diastolic dysfunction (impaired relaxation).   2. Right ventricular systolic function is normal. The right ventricular  size is normal.   3. Right atrial size was mildly dilated.   4. The mitral valve is grossly normal. No evidence of mitral valve  regurgitation.   5. The aortic valve is normal in structure. Aortic valve regurgitation is  not visualized.   Lexiscan Myoview 02/26/2020: IMPRESSION: 1. No reversible ischemia or infarction.   2. Normal left ventricular wall motion.   3. Left ventricular ejection fraction 61%   4. Non invasive risk stratification*: Low  Assessment and Plan:    Disposition:  Follow up {follow up:15908}  Signed, Satira Sark, M.D., F.A.C.C. [place] [facility]

## 2022-11-13 ENCOUNTER — Ambulatory Visit: Payer: Medicare HMO | Admitting: Cardiology

## 2022-11-13 DIAGNOSIS — R600 Localized edema: Secondary | ICD-10-CM

## 2023-01-13 ENCOUNTER — Other Ambulatory Visit (HOSPITAL_COMMUNITY): Payer: Self-pay | Admitting: Internal Medicine

## 2023-01-13 DIAGNOSIS — B182 Chronic viral hepatitis C: Secondary | ICD-10-CM

## 2023-02-19 ENCOUNTER — Encounter (INDEPENDENT_AMBULATORY_CARE_PROVIDER_SITE_OTHER): Payer: Self-pay | Admitting: *Deleted

## 2023-02-27 ENCOUNTER — Ambulatory Visit: Payer: Medicare HMO | Admitting: Cardiology

## 2023-02-27 ENCOUNTER — Other Ambulatory Visit: Payer: Self-pay | Admitting: Cardiology

## 2023-02-27 ENCOUNTER — Encounter: Payer: Self-pay | Admitting: Cardiology

## 2023-02-27 ENCOUNTER — Other Ambulatory Visit
Admission: RE | Admit: 2023-02-27 | Discharge: 2023-02-27 | Disposition: A | Discharge status: Home / Self Care | Payer: Medicare HMO | Source: Ambulatory Visit | Attending: Cardiology | Admitting: Cardiology

## 2023-02-27 VITALS — BP 126/70 | HR 103 | Ht 71.0 in | Wt 270.0 lb

## 2023-02-27 DIAGNOSIS — I200 Unstable angina: Secondary | ICD-10-CM

## 2023-02-27 DIAGNOSIS — R0789 Other chest pain: Secondary | ICD-10-CM | POA: Diagnosis present

## 2023-02-27 DIAGNOSIS — Z8249 Family history of ischemic heart disease and other diseases of the circulatory system: Secondary | ICD-10-CM

## 2023-02-27 DIAGNOSIS — I10 Essential (primary) hypertension: Secondary | ICD-10-CM | POA: Diagnosis not present

## 2023-02-27 DIAGNOSIS — E782 Mixed hyperlipidemia: Secondary | ICD-10-CM | POA: Diagnosis not present

## 2023-02-27 DIAGNOSIS — F419 Anxiety disorder, unspecified: Secondary | ICD-10-CM

## 2023-02-27 LAB — CBC
HCT: 41.1 % (ref 39.0–52.0)
Hemoglobin: 13.7 g/dL (ref 13.0–17.0)
MCH: 30.6 pg (ref 26.0–34.0)
MCHC: 33.3 g/dL (ref 30.0–36.0)
MCV: 91.7 fL (ref 80.0–100.0)
Platelets: 251 10*3/uL (ref 150–400)
RBC: 4.48 MIL/uL (ref 4.22–5.81)
RDW: 13.3 % (ref 11.5–15.5)
WBC: 7.1 10*3/uL (ref 4.0–10.5)
nRBC: 0 % (ref 0.0–0.2)

## 2023-02-27 LAB — BASIC METABOLIC PANEL
Anion gap: 9 (ref 5–15)
BUN: 9 mg/dL (ref 6–20)
CO2: 26 mmol/L (ref 22–32)
Calcium: 8.5 mg/dL — ABNORMAL LOW (ref 8.9–10.3)
Chloride: 101 mmol/L (ref 98–111)
Creatinine, Ser: 0.97 mg/dL (ref 0.61–1.24)
GFR, Estimated: >60 mL/min (ref >60)
Glucose, Bld: 91 mg/dL (ref 70–99)
Potassium: 3.8 mmol/L (ref 3.5–5.1)
Sodium: 136 mmol/L (ref 135–145)

## 2023-02-27 NOTE — H&P (View-Only) (Signed)
Cardiology Office Note  Date: 02/27/2023   ID: Jerome Camacho, DOB 1965-12-26, MRN [id number]  PCP: Alvina Filbert, MD  Chief Complaint:  Chief Complaint  Patient presents with   Chest discomfort   History of Present Illness: Jerome Camacho is a 57 y.o. male referred for cardiology consultation by Dr. Hyacinth Meeker after ER visit back in February with shortness of breath.  He was seen by Dr. Wyline Mood in 2021 for consultation regarding chest pain.  At that time he underwent echocardiogram and [facility] which were both reassuring as noted below.  He reports a history of recurring chest discomfort described as a burning, "menthol" sensation in the center of his chest, tends to be sporadic or in association with feeling anxious.  Can become very diaphoretic when this happens.  This has been going on over the last few years.  I reviewed his history.  States that his father underwent CABG at age 73.  He has a personal history of hypertension and hyperlipidemia on medical therapy.  Also hepatitis C which has not yet been treated but reportedly with pending GI consultation.  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  He follows with a counselor regularly and has been clean for the last 4 years.  We went over his medications, I reviewed his available lab work.  Review of Systems: As outlined in the history of present illness.  No palpitations or unexplained syncope.  No claudication.  Past Medical History: Past Medical History:  Diagnosis Date   Essential hypertension    Hepatitis C    History of substance abuse (HCC)    Previously oxycodone and then IV heroin - follows with counselor and has been clean for the last 4 years   Mixed hyperlipidemia    Past Surgical History: Past Surgical History:  Procedure Laterality Date   APPENDECTOMY     BACK SURGERY     Family History: Family History  Problem Relation Age of Onset    Arrhythmia Mother    Hypertension Mother    Glaucoma Mother    Stroke Father    Hyperlipidemia Father    Hypertension Father    Diabetes Father    Coronary artery disease Father    Social History:  Social History   Tobacco Use   Smoking status: Heavy Smoker    Current packs/day: 4.00    Types: Cigarettes   Smokeless tobacco: Never  Substance Use Topics   Alcohol use: No   Medications: Current Outpatient Medications on File Prior to Visit  Medication Sig Dispense Refill   acetaminophen (TYLENOL) 500 MG tablet Take 1,500 mg by mouth every 6 (six) hours as needed for moderate pain.     amLODipine (NORVASC) 5 MG tablet Take 1 tablet (5 mg total) by mouth daily. 30 tablet 1   aspirin EC 81 MG tablet Take 81 mg by mouth once. Swallow whole.     atorvastatin (LIPITOR) 80 MG tablet Take 80 mg by mouth daily.     Buprenorphine HCl-Naloxone HCl (SUBOXONE) 8-2 MG FILM Place 0.5 Film under the tongue 4 (four) times daily.     carbamazepine (TEGRETOL) 200 MG tablet Take 200 mg by mouth 3 (three) times daily.     cloNIDine (CATAPRES) 0.1 MG tablet Take 0.1 mg by mouth 3 (three) times daily as needed.     cyclobenzaprine (FLEXERIL) 10 MG tablet Take 10 mg by mouth every 8 (eight) hours.  desvenlafaxine (PRISTIQ) 50 MG 24 hr tablet Take 50 mg by mouth daily.     fluticasone (FLONASE) 50 MCG/ACT nasal spray Place 1 spray into both nostrils daily as needed for allergies.      gabapentin (NEURONTIN) 300 MG capsule Take 900 mg by mouth 3 (three) times daily.      hydrOXYzine (VISTARIL) 50 MG capsule Take 50 mg by mouth 3 (three) times daily as needed for anxiety.     QUEtiapine (SEROQUEL) 100 MG tablet Take 100 mg by mouth at bedtime.     tamsulosin (FLOMAX) 0.4 MG CAPS capsule Take 0.8 mg by mouth daily after breakfast.     No current facility-administered medications on file prior to visit.   Allergies: Allergies  Allergen Reactions   Nsaids Other (See Comments)    Internal bleeding     Cymbalta [Duloxetine Hcl] Other (See Comments)    Blood sugar "bottomed out"   Physical Exam: VS:  BP 126/70 (BP Location: Left Arm, Patient Position: Sitting, Cuff Size: Normal)   Pulse (!) 103   Ht 5\' 11"  (1.803 m)   Wt 270 lb (122.5 kg)   SpO2 95%   BMI 37.66 kg/m , BMI Body mass index is 37.66 kg/m.  Wt Readings from Last 3 Encounters:  02/27/23 270 lb (122.5 kg)  09/23/22 261 lb 8 oz (118.6 kg)  06/11/20 254 lb (115.2 kg)    General: Patient appears comfortable at rest. HEENT: Conjunctiva and lids normal, oropharynx clear. Neck: Supple, no elevated JVP or carotid bruits. Lungs: Clear to auscultation, nonlabored breathing at rest. Cardiac: Regular rate and rhythm, no S3 or significant systolic murmur. Abdomen: Soft, nontender, bowel sounds present. Extremities: No pitting edema, distal pulses 2+. Skin: Warm and dry. Musculoskeletal: No kyphosis. Neuropsychiatric: Alert and oriented x3, affect grossly appropriate.  ECG:  An ECG dated 09/23/2022 was personally reviewed today and demonstrated:  Sinus tachycardia.  Labwork: 09/23/2022: B Natriuretic Peptide 28.0; BUN 13; Creatinine, Ser 1.14; Potassium 3.9; Sodium 139 02/27/2023: Hemoglobin 13.7; Platelets 251     Component Value Date/Time   CHOL 192 02/24/2020 1655   TRIG 130 02/24/2020 1655   HDL 24 (L) 02/24/2020 1655   CHOLHDL 8.0 02/24/2020 1655   VLDL 26 02/24/2020 1655   LDLCALC 142 (H) 02/24/2020 1655   Other Studies Reviewed Today:  Echocardiogram 02/25/2020:  1. Global longitudinal strain is -21%.. Left ventricular ejection  fraction, by estimation, is 60 to 65%. The left ventricle has normal  function. The left ventricle has no regional wall motion abnormalities.  Left ventricular diastolic parameters are  consistent with Grade I diastolic dysfunction (impaired relaxation).   2. Right ventricular systolic function is normal. The right ventricular  size is normal.   3. Right atrial size was mildly dilated.    4. The mitral valve is grossly normal. No evidence of mitral valve  regurgitation.   5. The aortic valve is normal in structure. Aortic valve regurgitation is  not visualized.   Lexiscan Myoview 02/26/2020: IMPRESSION: 1. No reversible ischemia or infarction.   2. Normal left ventricular wall motion.   3. Left ventricular ejection fraction 61%   4. Non invasive risk stratification*: Low  Assessment and Plan:  1.  History of recurring chest discomfort with reassuring noninvasive workup including echocardiogram and Myoview back in 2021.  He has a family history of premature CAD and also personal history of hypertension, hyperlipidemia, and tobacco use.  Symptoms remain concerning for angina with high pretest probability of cardiovascular disease.  We discussed the risks and benefits of a diagnostic cardiac catheterization and he is in agreement to proceed.  He is currently on aspirin, Norvasc, and Lipitor which will be continued.  2.  Mixed hyperlipidemia on Lipitor 80 mg daily.  I do not have recent lipid panel for review.  3.  Essential hypertension, on Norvasc and clonidine at this time.  Systolic is in the 120s.  4.  History of substance abuse including oxycodone and eventually IV heroin, although clean for the last 4 years and following regularly with a counselor.  Disposition:  Follow up  after procedure.  Signed, Jonelle Sidle, M.D., F.A.C.C. [facility]

## 2023-02-27 NOTE — Patient Instructions (Signed)
  [place] [facility] [address] [place] [address] Dept: [phone] Loc: [phone]  ZAIYDEN STROZIER  02/27/2023  You are scheduled for a Cardiac Catheterization on Wednesday, July 24 with Dr. Peter [place].  1. Please arrive at the [facility] (Main Entrance A) at [facility]: [address] [place], [place] [address] at 6:30 AM (This time is 2 hour(s) before your procedure to ensure your preparation). Free valet parking service is available. You will check in at ADMITTING. The support person will be asked to wait in the waiting room.  It is OK to have someone drop you off and come back when you are ready to be discharged.    Special note: Every effort is made to have your procedure done on time. Please understand that emergencies sometimes delay scheduled procedures.  2. Diet: Do not eat solid foods after midnight.  The patient may have clear liquids until 5am upon the day of the procedure.  3. Labs: You will need to have blood drawn on TODAY,   4. Medication instructions in preparation for your procedure:   Contrast Allergy: No  On the morning of your procedure, take your Aspirin 81 mg and any morning medicines NOT listed above.  You may use sips of water.  5. Plan to go home the same day, you will only stay overnight if medically necessary. 6. Bring a current list of your medications and current insurance cards. 7. You MUST have a responsible person to drive you home. 8. Someone MUST be with you the first 24 hours after you arrive home or your discharge will be delayed. 9. Please wear clothes that are easy to get on and off and wear slip-on shoes.  Thank you for allowing [place] to care for you!   -- [facility]

## 2023-02-27 NOTE — Progress Notes (Signed)
Cardiology Office Note  Date: 02/27/2023   ID: Jerome Camacho, DOB 1965-12-26, MRN [id number]  PCP: Alvina Filbert, MD  Chief Complaint:  Chief Complaint  Patient presents with   Chest discomfort   History of Present Illness: Jerome Camacho is a 57 y.o. male referred for cardiology consultation by Dr. Hyacinth Meeker after ER visit back in February with shortness of breath.  He was seen by Dr. Wyline Mood in 2021 for consultation regarding chest pain.  At that time he underwent echocardiogram and [facility] which were both reassuring as noted below.  He reports a history of recurring chest discomfort described as a burning, "menthol" sensation in the center of his chest, tends to be sporadic or in association with feeling anxious.  Can become very diaphoretic when this happens.  This has been going on over the last few years.  I reviewed his history.  States that his father underwent CABG at age 73.  He has a personal history of hypertension and hyperlipidemia on medical therapy.  Also hepatitis C which has not yet been treated but reportedly with pending GI consultation.  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  He follows with a counselor regularly and has been clean for the last 4 years.  We went over his medications, I reviewed his available lab work.  Review of Systems: As outlined in the history of present illness.  No palpitations or unexplained syncope.  No claudication.  Past Medical History: Past Medical History:  Diagnosis Date   Essential hypertension    Hepatitis C    History of substance abuse (HCC)    Previously oxycodone and then IV heroin - follows with counselor and has been clean for the last 4 years   Mixed hyperlipidemia    Past Surgical History: Past Surgical History:  Procedure Laterality Date   APPENDECTOMY     BACK SURGERY     Family History: Family History  Problem Relation Age of Onset    Arrhythmia Mother    Hypertension Mother    Glaucoma Mother    Stroke Father    Hyperlipidemia Father    Hypertension Father    Diabetes Father    Coronary artery disease Father    Social History:  Social History   Tobacco Use   Smoking status: Heavy Smoker    Current packs/day: 4.00    Types: Cigarettes   Smokeless tobacco: Never  Substance Use Topics   Alcohol use: No   Medications: Current Outpatient Medications on File Prior to Visit  Medication Sig Dispense Refill   acetaminophen (TYLENOL) 500 MG tablet Take 1,500 mg by mouth every 6 (six) hours as needed for moderate pain.     amLODipine (NORVASC) 5 MG tablet Take 1 tablet (5 mg total) by mouth daily. 30 tablet 1   aspirin EC 81 MG tablet Take 81 mg by mouth once. Swallow whole.     atorvastatin (LIPITOR) 80 MG tablet Take 80 mg by mouth daily.     Buprenorphine HCl-Naloxone HCl (SUBOXONE) 8-2 MG FILM Place 0.5 Film under the tongue 4 (four) times daily.     carbamazepine (TEGRETOL) 200 MG tablet Take 200 mg by mouth 3 (three) times daily.     cloNIDine (CATAPRES) 0.1 MG tablet Take 0.1 mg by mouth 3 (three) times daily as needed.     cyclobenzaprine (FLEXERIL) 10 MG tablet Take 10 mg by mouth every 8 (eight) hours.  desvenlafaxine (PRISTIQ) 50 MG 24 hr tablet Take 50 mg by mouth daily.     fluticasone (FLONASE) 50 MCG/ACT nasal spray Place 1 spray into both nostrils daily as needed for allergies.      gabapentin (NEURONTIN) 300 MG capsule Take 900 mg by mouth 3 (three) times daily.      hydrOXYzine (VISTARIL) 50 MG capsule Take 50 mg by mouth 3 (three) times daily as needed for anxiety.     QUEtiapine (SEROQUEL) 100 MG tablet Take 100 mg by mouth at bedtime.     tamsulosin (FLOMAX) 0.4 MG CAPS capsule Take 0.8 mg by mouth daily after breakfast.     No current facility-administered medications on file prior to visit.   Allergies: Allergies  Allergen Reactions   Nsaids Other (See Comments)    Internal bleeding     Cymbalta [Duloxetine Hcl] Other (See Comments)    Blood sugar "bottomed out"   Physical Exam: VS:  BP 126/70 (BP Location: Left Arm, Patient Position: Sitting, Cuff Size: Normal)   Pulse (!) 103   Ht 5\' 11"  (1.803 m)   Wt 270 lb (122.5 kg)   SpO2 95%   BMI 37.66 kg/m , BMI Body mass index is 37.66 kg/m.  Wt Readings from Last 3 Encounters:  02/27/23 270 lb (122.5 kg)  09/23/22 261 lb 8 oz (118.6 kg)  06/11/20 254 lb (115.2 kg)    General: Patient appears comfortable at rest. HEENT: Conjunctiva and lids normal, oropharynx clear. Neck: Supple, no elevated JVP or carotid bruits. Lungs: Clear to auscultation, nonlabored breathing at rest. Cardiac: Regular rate and rhythm, no S3 or significant systolic murmur. Abdomen: Soft, nontender, bowel sounds present. Extremities: No pitting edema, distal pulses 2+. Skin: Warm and dry. Musculoskeletal: No kyphosis. Neuropsychiatric: Alert and oriented x3, affect grossly appropriate.  ECG:  An ECG dated 09/23/2022 was personally reviewed today and demonstrated:  Sinus tachycardia.  Labwork: 09/23/2022: B Natriuretic Peptide 28.0; BUN 13; Creatinine, Ser 1.14; Potassium 3.9; Sodium 139 02/27/2023: Hemoglobin 13.7; Platelets 251     Component Value Date/Time   CHOL 192 02/24/2020 1655   TRIG 130 02/24/2020 1655   HDL 24 (L) 02/24/2020 1655   CHOLHDL 8.0 02/24/2020 1655   VLDL 26 02/24/2020 1655   LDLCALC 142 (H) 02/24/2020 1655   Other Studies Reviewed Today:  Echocardiogram 02/25/2020:  1. Global longitudinal strain is -21%.. Left ventricular ejection  fraction, by estimation, is 60 to 65%. The left ventricle has normal  function. The left ventricle has no regional wall motion abnormalities.  Left ventricular diastolic parameters are  consistent with Grade I diastolic dysfunction (impaired relaxation).   2. Right ventricular systolic function is normal. The right ventricular  size is normal.   3. Right atrial size was mildly dilated.    4. The mitral valve is grossly normal. No evidence of mitral valve  regurgitation.   5. The aortic valve is normal in structure. Aortic valve regurgitation is  not visualized.   Lexiscan Myoview 02/26/2020: IMPRESSION: 1. No reversible ischemia or infarction.   2. Normal left ventricular wall motion.   3. Left ventricular ejection fraction 61%   4. Non invasive risk stratification*: Low  Assessment and Plan:  1.  History of recurring chest discomfort with reassuring noninvasive workup including echocardiogram and Myoview back in 2021.  He has a family history of premature CAD and also personal history of hypertension, hyperlipidemia, and tobacco use.  Symptoms remain concerning for angina with high pretest probability of cardiovascular disease.  We discussed the risks and benefits of a diagnostic cardiac catheterization and he is in agreement to proceed.  He is currently on aspirin, Norvasc, and Lipitor which will be continued.  2.  Mixed hyperlipidemia on Lipitor 80 mg daily.  I do not have recent lipid panel for review.  3.  Essential hypertension, on Norvasc and clonidine at this time.  Systolic is in the 120s.  4.  History of substance abuse including oxycodone and eventually IV heroin, although clean for the last 4 years and following regularly with a counselor.  Disposition:  Follow up  after procedure.  Signed, Jonelle Sidle, M.D., F.A.C.C. [facility]

## 2023-03-03 ENCOUNTER — Telehealth: Payer: Self-pay | Admitting: *Deleted

## 2023-03-03 NOTE — Telephone Encounter (Addendum)
Cardiac Catheterization scheduled at [facility] for: Wednesday March 04, 2023 8:30 AM Arrival time [facility] Main Entrance A at: 6:30 AM  Nothing to eat after midnight prior to procedure, clear liquids until 5 AM day of procedure.  Medication instructions: -Usual morning medications can be taken with sips of water including aspirin 81 mg.  Confirmed patient has responsible adult to drive home post procedure and be with patient first 24 hours after arriving home.  Plan to go home the same day, you will only stay overnight if medically necessary.  Reviewed procedure instructions with patient's wife (pt verbal permission).

## 2023-03-04 ENCOUNTER — Ambulatory Visit (HOSPITAL_COMMUNITY)
Admission: RE | Admit: 2023-03-04 | Discharge: 2023-03-04 | Disposition: A | Discharge status: Home / Self Care | Payer: Medicare HMO | Source: Ambulatory Visit | Attending: Cardiology | Admitting: Cardiology

## 2023-03-04 ENCOUNTER — Encounter (HOSPITAL_COMMUNITY)
Admission: RE | Disposition: A | Discharge status: Home / Self Care | Payer: Self-pay | Source: Ambulatory Visit | Attending: Cardiology

## 2023-03-04 DIAGNOSIS — Z7982 Long term (current) use of aspirin: Secondary | ICD-10-CM | POA: Insufficient documentation

## 2023-03-04 DIAGNOSIS — R079 Chest pain, unspecified: Secondary | ICD-10-CM | POA: Insufficient documentation

## 2023-03-04 DIAGNOSIS — Z79899 Other long term (current) drug therapy: Secondary | ICD-10-CM | POA: Diagnosis not present

## 2023-03-04 DIAGNOSIS — I200 Unstable angina: Secondary | ICD-10-CM

## 2023-03-04 DIAGNOSIS — I10 Essential (primary) hypertension: Secondary | ICD-10-CM | POA: Insufficient documentation

## 2023-03-04 DIAGNOSIS — E782 Mixed hyperlipidemia: Secondary | ICD-10-CM | POA: Diagnosis not present

## 2023-03-04 DIAGNOSIS — F17210 Nicotine dependence, cigarettes, uncomplicated: Secondary | ICD-10-CM | POA: Diagnosis not present

## 2023-03-04 HISTORY — PX: LEFT HEART CATH AND CORONARY ANGIOGRAPHY: CATH118249

## 2023-03-04 SURGERY — LEFT HEART CATH AND CORONARY ANGIOGRAPHY
Anesthesia: LOCAL

## 2023-03-04 MED ORDER — ONDANSETRON HCL 4 MG/2ML IJ SOLN: 4.0000 mg | Freq: Four times a day (QID) | INTRAMUSCULAR | Status: DC | PRN

## 2023-03-04 MED ORDER — MIDAZOLAM HCL 2 MG/2ML IJ SOLN
INTRAMUSCULAR | Status: DC | PRN
  Administered 2023-03-04: 2 mg via INTRAVENOUS

## 2023-03-04 MED ORDER — IOHEXOL 350 MG/ML SOLN
INTRAVENOUS | Status: DC | PRN
  Administered 2023-03-04: 36 mL

## 2023-03-04 MED ORDER — ASPIRIN 81 MG PO CHEW: 81.0000 mg | CHEWABLE_TABLET | ORAL | Status: DC

## 2023-03-04 MED ORDER — HEPARIN SODIUM (PORCINE) 1000 UNIT/ML IJ SOLN
INTRAMUSCULAR | Status: DC | PRN
  Administered 2023-03-04: 6000 [IU] via INTRAVENOUS

## 2023-03-04 MED ORDER — SODIUM CHLORIDE 0.9 % WEIGHT BASED INFUSION: 1.0000 mL/kg/h | INTRAVENOUS | Status: DC

## 2023-03-04 MED ORDER — VERAPAMIL HCL 2.5 MG/ML IV SOLN
INTRAVENOUS | Status: DC | PRN
  Administered 2023-03-04: 10 mL via INTRA_ARTERIAL

## 2023-03-04 MED ORDER — LIDOCAINE HCL (PF) 1 % IJ SOLN
INTRAMUSCULAR | Status: AC
  Filled 2023-03-04: qty 30

## 2023-03-04 MED ORDER — HEPARIN SODIUM (PORCINE) 1000 UNIT/ML IJ SOLN
INTRAMUSCULAR | Status: AC
  Filled 2023-03-04: qty 10

## 2023-03-04 MED ORDER — SODIUM CHLORIDE 0.9 % IV SOLN: 250.0000 mL | INTRAVENOUS | Status: DC | PRN

## 2023-03-04 MED ORDER — SODIUM CHLORIDE 0.9% FLUSH: 3.0000 mL | Freq: Two times a day (BID) | INTRAVENOUS | Status: DC

## 2023-03-04 MED ORDER — MIDAZOLAM HCL 2 MG/2ML IJ SOLN
INTRAMUSCULAR | Status: AC
  Filled 2023-03-04: qty 2

## 2023-03-04 MED ORDER — SODIUM CHLORIDE 0.9% FLUSH: 3.0000 mL | INTRAVENOUS | Status: DC | PRN

## 2023-03-04 MED ORDER — LIDOCAINE HCL (PF) 1 % IJ SOLN
INTRAMUSCULAR | Status: DC | PRN
  Administered 2023-03-04: 2 mL via INTRADERMAL

## 2023-03-04 MED ORDER — ACETAMINOPHEN 325 MG PO TABS: 650.0000 mg | ORAL_TABLET | ORAL | Status: DC | PRN

## 2023-03-04 MED ORDER — VERAPAMIL HCL 2.5 MG/ML IV SOLN
INTRAVENOUS | Status: AC
  Filled 2023-03-04: qty 2

## 2023-03-04 MED ORDER — SODIUM CHLORIDE 0.9 % WEIGHT BASED INFUSION
3.0000 mL/kg/h | INTRAVENOUS | Status: AC
  Administered 2023-03-04: 3 mL/kg/h via INTRAVENOUS

## 2023-03-04 MED ORDER — HEPARIN (PORCINE) IN NACL 1000-0.9 UT/500ML-% IV SOLN
INTRAVENOUS | Status: DC | PRN
  Administered 2023-03-04 (×2): 500 mL

## 2023-03-04 SURGICAL SUPPLY — 10 items
CATH 5FR JL3.5 JR4 ANG PIG MP (CATHETERS) IMPLANT
DEVICE RAD COMP TR BAND LRG (VASCULAR PRODUCTS) IMPLANT
GLIDESHEATH SLEND SS 6F .021 (SHEATH) IMPLANT
GUIDEWIRE INQWIRE 1.5J.035X260 (WIRE) IMPLANT
INQWIRE 1.5J .035X260CM (WIRE) ×1
KIT SYRINGE INJ CVI SPIKEX1 (MISCELLANEOUS) IMPLANT
PACK CARDIAC CATHETERIZATION (CUSTOM PROCEDURE TRAY) ×1 IMPLANT
PROTECTION STATION PRESSURIZED (MISCELLANEOUS) ×1
SET ATX-X65L (MISCELLANEOUS) IMPLANT
STATION PROTECTION PRESSURIZED (MISCELLANEOUS) IMPLANT

## 2023-03-04 NOTE — Interval H&P Note (Signed)
History and Physical Interval Note:  03/04/2023 7:20 AM  Jerome Camacho  has presented today for surgery, with the diagnosis of angina.  The various methods of treatment have been discussed with the patient and family. After consideration of risks, benefits and other options for treatment, the patient has consented to  Procedure(s): LEFT HEART CATH AND CORONARY ANGIOGRAPHY (N/A) as a surgical intervention.  The patient's history has been reviewed, patient examined, no change in status, stable for surgery.  I have reviewed the patient's chart and labs.  Questions were answered to the patient's satisfaction.   Cath Lab Visit (complete for each Cath Lab visit)  Clinical Evaluation Leading to the Procedure:   ACS: No.  Non-ACS:    Anginal Classification: CCS II  Anti-ischemic medical therapy: Maximal Therapy (2 or more classes of medications)  Non-Invasive Test Results: No non-invasive testing performed  Prior CABG: No previous CABG        Theron Arista [facility] 03/04/2023 7:20 AM\

## 2023-03-05 ENCOUNTER — Encounter (HOSPITAL_COMMUNITY): Payer: Self-pay | Admitting: Cardiology

## 2023-03-19 ENCOUNTER — Ambulatory Visit (INDEPENDENT_AMBULATORY_CARE_PROVIDER_SITE_OTHER): Payer: Medicare HMO | Admitting: Gastroenterology

## 2023-04-02 ENCOUNTER — Ambulatory Visit (INDEPENDENT_AMBULATORY_CARE_PROVIDER_SITE_OTHER): Payer: Medicare HMO | Admitting: Gastroenterology

## 2023-04-02 ENCOUNTER — Encounter: Payer: Self-pay | Admitting: *Deleted

## 2023-04-02 ENCOUNTER — Other Ambulatory Visit: Payer: Self-pay | Admitting: *Deleted

## 2023-04-02 ENCOUNTER — Encounter (INDEPENDENT_AMBULATORY_CARE_PROVIDER_SITE_OTHER): Payer: Self-pay | Admitting: Gastroenterology

## 2023-04-02 VITALS — BP 116/75 | HR 80 | Temp 97.8°F | Ht 70.0 in | Wt 273.8 lb

## 2023-04-02 DIAGNOSIS — R768 Other specified abnormal immunological findings in serum: Secondary | ICD-10-CM | POA: Insufficient documentation

## 2023-04-02 DIAGNOSIS — B182 Chronic viral hepatitis C: Secondary | ICD-10-CM | POA: Diagnosis not present

## 2023-04-02 DIAGNOSIS — B1920 Unspecified viral hepatitis C without hepatic coma: Secondary | ICD-10-CM | POA: Insufficient documentation

## 2023-04-02 MED ORDER — PEG 3350-KCL-NA BICARB-NACL 420 G PO SOLR: 4000.0000 mL | Freq: Once | ORAL | 0 refills | Status: AC

## 2023-04-02 NOTE — Patient Instructions (Signed)
Perform blood workup Schedule colonoscopy Schedule liver elastography

## 2023-04-02 NOTE — H&P (View-Only) (Signed)
 Katrinka Blazing, M.D. Gastroenterology & Hepatology [facility] [address] [place], [place] [address] Primary Care Physician: Alvina Filbert, MD [address] [place] [address]  Referring MD: PCP  Chief Complaint: Hepatitis C and possible hepatitis B  History of Present Illness: ADOM CABADAS is a 57 y.o. male with PMH opiate and drug abuse, hepatitis C, hyperlipidemia, hypertension, who presents for evaluation of hepatitis C and possible hepatitis B.  Patient was referred to our office for hepatitis C treatment.  He also had some unclear serology for hepatitis B.  He states he was told he may have hepatitis B as well. States that he was told they were planning to work out a way to get treatment for hepatitis C approved, but he has not heard from his PCP yet. Last visit at their office was 2 months ago.  Upon review of records from 09/09/2022, he had positive HCVRNA of 16,700,000, genotype 1a, had negative hepatitis B core antibody but had hepatitis B surface antigen reactive x 2. No other serologies are available.  Patient reports that he acquired hepatitis C possibly from IV drugs. Also had a transfusion in 1991. Has not used drugs for 4 years - is on Suboxone.  Family member reports he has felt nauseated and tired frequently. The patient denies having any vomiting, fever, chills, hematochezia, melena, hematemesis, abdominal distention, abdominal pain, diarrhea, jaundice, pruritus or weight loss.  Sometimes has heartburn and takes Prilosec.  Patient had a right upper quadrant ultrasound on 07/25/2022 that showed increased echotexture of the liver and a simple cyst in the right lobe of the liver.  CT abdomen and pelvis with and without IV contrast on 10/16/2020 showed a 1.3 cm right interpolar renal lesion consistent with an hemorrhagic/proteinaceous renal cyst, no other liver lesions besides benign cyst.  Notably, the patient has  been seen by Dr. Diona Browner for episodes of chest discomfort.  Had a Myoview exam in 2021 that was negative.  Underwent a subsequent heart cath on 03/04/2023 showing normal coronary anatomy.  Last NGE:XBMWU Last Colonoscopy:1991 - perofrmed for rectal bleeding, no report available  FHx: neg for any gastrointestinal/liver disease, father thyroid cancer, lung cancer multiple family members Social: smokes 2 packs a day, neg alcohol, currently smokes marijuana, quit other drugs 4 years ago heroin and oxycontin, morphine Surgical: appendectomy  Past Medical History: Past Medical History:  Diagnosis Date   Essential hypertension    Hepatitis C    History of substance abuse (HCC)    Previously oxycodone and then IV heroin - follows with counselor and has been clean for the last 4 years   Mixed hyperlipidemia     Past Surgical History: Past Surgical History:  Procedure Laterality Date   APPENDECTOMY     BACK SURGERY     LEFT HEART CATH AND CORONARY ANGIOGRAPHY N/A 03/04/2023   Procedure: LEFT HEART CATH AND CORONARY ANGIOGRAPHY;  Surgeon: Swazil[place], Peter M, MD;  Location: [facility];  Service: Cardiovascular;  Laterality: N/A;    Family History: Family History  Problem Relation Age of Onset   Arrhythmia Mother    Hypertension Mother    Glaucoma Mother    Stroke Father    Hyperlipidemia Father    Hypertension Father    Diabetes Father    Coronary artery disease Father     Social History: Social History   Tobacco Use  Smoking Status Heavy Smoker   Current packs/day: 4.00   Types: Cigarettes  Smokeless Tobacco Never   Social History   Substance and Sexual Activity  Alcohol Use No   Social History   Substance and Sexual Activity  Drug Use Not Currently   Types: IV    Allergies: Allergies  Allergen Reactions   Nsaids Other (See Comments)    Internal bleeding    Cymbalta [Duloxetine Hcl] Other (See Comments)    Blood sugar "bottomed out"     Medications: Current Outpatient Medications  Medication Sig Dispense Refill   acetaminophen (TYLENOL) 500 MG tablet Take 1,500 mg by mouth every 6 (six) hours as needed for moderate pain.     amLODipine (NORVASC) 5 MG tablet Take 5 mg by mouth daily.     aspirin EC 81 MG tablet Take 81 mg by mouth daily. Swallow whole.     atorvastatin (LIPITOR) 80 MG tablet Take 80 mg by mouth daily.     Buprenorphine HCl-Naloxone HCl (SUBOXONE) 8-2 MG FILM Place 0.5 Film under the tongue 4 (four) times daily.     carbamazepine (TEGRETOL) 200 MG tablet Take 200 mg by mouth 2 (two) times daily.     cloNIDine (CATAPRES) 0.1 MG tablet Take 0.1 mg by mouth 3 (three) times daily as needed (High BP/ Anxiety).     cyclobenzaprine (FLEXERIL) 10 MG tablet Take 10 mg by mouth 3 (three) times daily.     fluticasone (FLONASE) 50 MCG/ACT nasal spray Place 1 spray into both nostrils daily as needed for allergies.      gabapentin (NEURONTIN) 300 MG capsule Take 900 mg by mouth 3 (three) times daily.      hydrOXYzine (VISTARIL) 50 MG capsule Take 50 mg by mouth 3 (three) times daily as needed for anxiety.     QUEtiapine (SEROQUEL) 100 MG tablet Take 100 mg by mouth at bedtime.     QUEtiapine (SEROQUEL) 25 MG tablet Take 50 mg by mouth 2 (two) times daily as needed (agitation).     tamsulosin (FLOMAX) 0.4 MG CAPS capsule Take 0.8 mg by mouth daily after breakfast.     No current facility-administered medications for this visit.    Review of Systems: GENERAL: negative for malaise, night sweats HEENT: No changes in hearing or vision, no nose bleeds or other nasal problems. NECK: Negative for lumps, goiter, pain and significant neck swelling RESPIRATORY: Negative for cough, wheezing CARDIOVASCULAR: Negative for chest pain, leg swelling, palpitations, orthopnea GI: SEE HPI MUSCULOSKELETAL: Negative for joint pain or swelling, back pain, and muscle pain. SKIN: Negative for lesions, rash PSYCH: Negative for sleep  disturbance, mood disorder and recent psychosocial stressors. HEMATOLOGY Negative for prolonged bleeding, bruising easily, and swollen nodes. ENDOCRINE: Negative for cold or heat intolerance, polyuria, polydipsia and goiter. NEURO: negative for tremor, gait imbalance, syncope and seizures. The remainder of the review of systems is noncontributory.   Physical Exam: BP 116/75 (BP Location: Left Arm, Patient Position: Sitting, Cuff Size: Large)   Pulse 80   Temp 97.8 F (36.6 C) (Temporal)   Ht 5\' 10"  (1.778 m)   Wt 273 lb 12.8 oz (124.2 kg)   BMI 39.29 kg/m  GENERAL: The patient is AO x3, in no acute distress. HEENT: Head is normocephalic and atraumatic. EOMI are intact. Mouth is well hydrated and without lesions. NECK: Supple. No masses LUNGS: Clear to auscultation. No presence of rhonchi/wheezing/rales. Adequate chest expansion HEART: RRR, normal s1 and s2. ABDOMEN: Soft, nontender, no guarding, no peritoneal signs, and nondistended. BS +. No masses. EXTREMITIES: Without any cyanosis, clubbing, rash, lesions  or edema. NEUROLOGIC: AOx3, no focal motor deficit. SKIN: no jaundice, no rashes   Imaging/Labs: as above  I personally reviewed and interpreted the available labs, imaging and endoscopic files.  Impression and Plan: HUD GIUSTI is a 57 y.o. male with PMH opiate and drug abuse, hepatitis C, hyperlipidemia, hypertension, who presents for evaluation of hepatitis C and possible hepatitis B.  Patient was diagnosed with genotype Ia active hepatitis C.  He is treatment nave.  It is very likely he acquired this infection via IV drug use but he has abstained from drug use for the last 4 years.  We discussed about the possible treatment options and efficacy of direct antiviral agents.  We discussed the importance of treatment to prevent progression to cirrhosis and HCC.  Also I discussed with his partner the importance of her being tested as it is possible she has asymptomatic  infection as well.  I also emphasized that we need to confirm his hepatitis B status as treatment with DAA can exacerbate hepatitis B -we will obtain serologies as if he has previous exposure to hepatitis B, will need to monitor closely when starting at the age, but if he has active hepatitis B he may require concomitant treatment with Entecavir or tenofovir.  As he would like to have all his care at the same location, we will obtain serologies for potential treatment of hepatitis B and C.  Will also obtain liver elastography.  Finally, he is due for colorectal cancer screening, we will schedule colonoscopy.  -Check CBC,CMP, Hep B surface Ag/Ab, e Ag/Ab, viral load, HCV viral load, HIV -Schedule colonoscopy -Schedule liver elastography  All questions were answered.      Katrinka Blazing, MD Gastroenterology and Hepatology [facility]

## 2023-04-02 NOTE — Progress Notes (Signed)
Katrinka Blazing, M.D. Gastroenterology & Hepatology [facility] [address] [place], [place] [address] Primary Care Physician: Alvina Filbert, MD [address] [place] [address]  Referring MD: PCP  Chief Complaint: Hepatitis C and possible hepatitis B  History of Present Illness: ADOM CABADAS is a 57 y.o. male with PMH opiate and drug abuse, hepatitis C, hyperlipidemia, hypertension, who presents for evaluation of hepatitis C and possible hepatitis B.  Patient was referred to our office for hepatitis C treatment.  He also had some unclear serology for hepatitis B.  He states he was told he may have hepatitis B as well. States that he was told they were planning to work out a way to get treatment for hepatitis C approved, but he has not heard from his PCP yet. Last visit at their office was 2 months ago.  Upon review of records from 09/09/2022, he had positive HCVRNA of 16,700,000, genotype 1a, had negative hepatitis B core antibody but had hepatitis B surface antigen reactive x 2. No other serologies are available.  Patient reports that he acquired hepatitis C possibly from IV drugs. Also had a transfusion in 1991. Has not used drugs for 4 years - is on Suboxone.  Family member reports he has felt nauseated and tired frequently. The patient denies having any vomiting, fever, chills, hematochezia, melena, hematemesis, abdominal distention, abdominal pain, diarrhea, jaundice, pruritus or weight loss.  Sometimes has heartburn and takes Prilosec.  Patient had a right upper quadrant ultrasound on 07/25/2022 that showed increased echotexture of the liver and a simple cyst in the right lobe of the liver.  CT abdomen and pelvis with and without IV contrast on 10/16/2020 showed a 1.3 cm right interpolar renal lesion consistent with an hemorrhagic/proteinaceous renal cyst, no other liver lesions besides benign cyst.  Notably, the patient has  been seen by Dr. Diona Browner for episodes of chest discomfort.  Had a Myoview exam in 2021 that was negative.  Underwent a subsequent heart cath on 03/04/2023 showing normal coronary anatomy.  Last NGE:XBMWU Last Colonoscopy:1991 - perofrmed for rectal bleeding, no report available  FHx: neg for any gastrointestinal/liver disease, father thyroid cancer, lung cancer multiple family members Social: smokes 2 packs a day, neg alcohol, currently smokes marijuana, quit other drugs 4 years ago heroin and oxycontin, morphine Surgical: appendectomy  Past Medical History: Past Medical History:  Diagnosis Date   Essential hypertension    Hepatitis C    History of substance abuse (HCC)    Previously oxycodone and then IV heroin - follows with counselor and has been clean for the last 4 years   Mixed hyperlipidemia     Past Surgical History: Past Surgical History:  Procedure Laterality Date   APPENDECTOMY     BACK SURGERY     LEFT HEART CATH AND CORONARY ANGIOGRAPHY N/A 03/04/2023   Procedure: LEFT HEART CATH AND CORONARY ANGIOGRAPHY;  Surgeon: Sw[place]il[place], Peter M, MD;  Location: [facility];  Service: Cardiovascular;  Laterality: N/A;    Family History: Family History  Problem Relation Age of Onset   Arrhythmia Mother    Hypertension Mother    Glaucoma Mother    Stroke Father    Hyperlipidemia Father    Hypertension Father    Diabetes Father    Coronary artery disease Father     Social History: Social History   Tobacco Use  Smoking Status Heavy Smoker   Current packs/day: 4.00   Types: Cigarettes  Smokeless Tobacco Never   Social History   Substance and Sexual Activity  Alcohol Use No   Social History   Substance and Sexual Activity  Drug Use Not Currently   Types: IV    Allergies: Allergies  Allergen Reactions   Nsaids Other (See Comments)    Internal bleeding    Cymbalta [Duloxetine Hcl] Other (See Comments)    Blood sugar "bottomed out"     Medications: Current Outpatient Medications  Medication Sig Dispense Refill   acetaminophen (TYLENOL) 500 MG tablet Take 1,500 mg by mouth every 6 (six) hours as needed for moderate pain.     amLODipine (NORVASC) 5 MG tablet Take 5 mg by mouth daily.     aspirin EC 81 MG tablet Take 81 mg by mouth daily. Swallow whole.     atorvastatin (LIPITOR) 80 MG tablet Take 80 mg by mouth daily.     Buprenorphine HCl-Naloxone HCl (SUBOXONE) 8-2 MG FILM Place 0.5 Film under the tongue 4 (four) times daily.     carbamazepine (TEGRETOL) 200 MG tablet Take 200 mg by mouth 2 (two) times daily.     cloNIDine (CATAPRES) 0.1 MG tablet Take 0.1 mg by mouth 3 (three) times daily as needed (High BP/ Anxiety).     cyclobenzaprine (FLEXERIL) 10 MG tablet Take 10 mg by mouth 3 (three) times daily.     fluticasone (FLONASE) 50 MCG/ACT nasal spray Place 1 spray into both nostrils daily as needed for allergies.      gabapentin (NEURONTIN) 300 MG capsule Take 900 mg by mouth 3 (three) times daily.      hydrOXYzine (VISTARIL) 50 MG capsule Take 50 mg by mouth 3 (three) times daily as needed for anxiety.     QUEtiapine (SEROQUEL) 100 MG tablet Take 100 mg by mouth at bedtime.     QUEtiapine (SEROQUEL) 25 MG tablet Take 50 mg by mouth 2 (two) times daily as needed (agitation).     tamsulosin (FLOMAX) 0.4 MG CAPS capsule Take 0.8 mg by mouth daily after breakfast.     No current facility-administered medications for this visit.    Review of Systems: GENERAL: negative for malaise, night sweats HEENT: No changes in hearing or vision, no nose bleeds or other nasal problems. NECK: Negative for lumps, goiter, pain and significant neck swelling RESPIRATORY: Negative for cough, wheezing CARDIOVASCULAR: Negative for chest pain, leg swelling, palpitations, orthopnea GI: SEE HPI MUSCULOSKELETAL: Negative for joint pain or swelling, back pain, and muscle pain. SKIN: Negative for lesions, rash PSYCH: Negative for sleep  disturbance, mood disorder and recent psychosocial stressors. HEMATOLOGY Negative for prolonged bleeding, bruising easily, and swollen nodes. ENDOCRINE: Negative for cold or heat intolerance, polyuria, polydipsia and goiter. NEURO: negative for tremor, gait imbalance, syncope and seizures. The remainder of the review of systems is noncontributory.   Physical Exam: BP 116/75 (BP Location: Left Arm, Patient Position: Sitting, Cuff Size: Large)   Pulse 80   Temp 97.8 F (36.6 C) (Temporal)   Ht 5\' 10"  (1.778 m)   Wt 273 lb 12.8 oz (124.2 kg)   BMI 39.29 kg/m  GENERAL: The patient is AO x3, in no acute distress. HEENT: Head is normocephalic and atraumatic. EOMI are intact. Mouth is well hydrated and without lesions. NECK: Supple. No masses LUNGS: Clear to auscultation. No presence of rhonchi/wheezing/rales. Adequate chest expansion HEART: RRR, normal s1 and s2. ABDOMEN: Soft, nontender, no guarding, no peritoneal signs, and nondistended. BS +. No masses. EXTREMITIES: Without any cyanosis, clubbing, rash, lesions  or edema. NEUROLOGIC: AOx3, no focal motor deficit. SKIN: no jaundice, no rashes   Imaging/Labs: as above  I personally reviewed and interpreted the available labs, imaging and endoscopic files.  Impression and Plan: HUD GIUSTI is a 57 y.o. male with PMH opiate and drug abuse, hepatitis C, hyperlipidemia, hypertension, who presents for evaluation of hepatitis C and possible hepatitis B.  Patient was diagnosed with genotype Ia active hepatitis C.  He is treatment nave.  It is very likely he acquired this infection via IV drug use but he has abstained from drug use for the last 4 years.  We discussed about the possible treatment options and efficacy of direct antiviral agents.  We discussed the importance of treatment to prevent progression to cirrhosis and HCC.  Also I discussed with his partner the importance of her being tested as it is possible she has asymptomatic  infection as well.  I also emphasized that we need to confirm his hepatitis B status as treatment with DAA can exacerbate hepatitis B -we will obtain serologies as if he has previous exposure to hepatitis B, will need to monitor closely when starting at the age, but if he has active hepatitis B he may require concomitant treatment with Entecavir or tenofovir.  As he would like to have all his care at the same location, we will obtain serologies for potential treatment of hepatitis B and C.  Will also obtain liver elastography.  Finally, he is due for colorectal cancer screening, we will schedule colonoscopy.  -Check CBC,CMP, Hep B surface Ag/Ab, e Ag/Ab, viral load, HCV viral load, HIV -Schedule colonoscopy -Schedule liver elastography  All questions were answered.      Katrinka Blazing, MD Gastroenterology and Hepatology [facility]

## 2023-04-07 ENCOUNTER — Encounter: Payer: Self-pay | Admitting: Student

## 2023-04-07 ENCOUNTER — Encounter: Payer: Self-pay | Admitting: *Deleted

## 2023-04-07 ENCOUNTER — Ambulatory Visit: Payer: Medicare HMO | Attending: Student | Admitting: Student

## 2023-04-07 VITALS — BP 110/62 | HR 82 | Ht 71.0 in | Wt 270.4 lb

## 2023-04-07 DIAGNOSIS — Z87898 Personal history of other specified conditions: Secondary | ICD-10-CM

## 2023-04-07 DIAGNOSIS — Z79899 Other long term (current) drug therapy: Secondary | ICD-10-CM

## 2023-04-07 DIAGNOSIS — I10 Essential (primary) hypertension: Secondary | ICD-10-CM

## 2023-04-07 DIAGNOSIS — J439 Emphysema, unspecified: Secondary | ICD-10-CM

## 2023-04-07 DIAGNOSIS — R600 Localized edema: Secondary | ICD-10-CM | POA: Diagnosis not present

## 2023-04-07 DIAGNOSIS — R0609 Other forms of dyspnea: Secondary | ICD-10-CM | POA: Diagnosis not present

## 2023-04-07 DIAGNOSIS — E782 Mixed hyperlipidemia: Secondary | ICD-10-CM

## 2023-04-07 MED ORDER — FUROSEMIDE 20 MG PO TABS: 20.0000 mg | ORAL_TABLET | Freq: Every day | ORAL | 3 refills | Status: DC

## 2023-04-07 NOTE — Progress Notes (Signed)
Cardiology Office Note    Date:  04/07/2023  ID:  Shamik, Chowdhry 06/05/1966, MRN [id number] Cardiologist: Nona Dell, MD    History of Present Illness:    AMAAR HENCKEL is a 57 y.o. male with past medical history of HTN, HLD, Hepatitis C, history of substance use and family history of CAD who presents to the office today for follow-up from his recent cardiac catheterization.  He was last examined by Dr. Diona Browner in 02/2023 following a recent Emergency Department visit for shortness of breath. He reported worsening dyspnea on exertion along with episodes of chest discomfort and diaphoresis which had been intermittent over the past several years. Given his concerning symptoms, a cardiac catheterization was recommended for definitive evaluation. This was performed by Dr. Sw[place] on 03/04/2023 and showed normal coronary anatomy with normal LV function. LVEDP was elevated at 26 mmHg.  In talking with the patient and his wife today, he reports still having shortness of breath with activity which has been unchanged since his catheterization. He denies any recurrent chest pain. No specific orthopnea, PND or pitting edema. Has been experiencing fatigue for several years. Was previously told he had sleep apnea but has been without a CPAP for 10+ years and did not tolerate this well. He does report having intermittent lower extremity edema which has been occurring for several years. Has been prescribed intermittent Lasix by prior providers with improvement in symptoms. He consumes over 2 L of soda a day and also adds salt to his food. Still smokes 2 ppd (previously smoking 4 ppd).   Studies Reviewed:   EKG: EKG is not ordered today.  Echocardiogram: 02/2020 IMPRESSIONS     1. Global longitudinal strain is -21%.. Left ventricular ejection  fraction, by estimation, is 60 to 65%. The left ventricle has normal  function. The left ventricle has no regional wall motion abnormalities.  Left  ventricular diastolic parameters are  consistent with Grade I diastolic dysfunction (impaired relaxation).   2. Right ventricular systolic function is normal. The right ventricular  size is normal.   3. Right atrial size was mildly dilated.   4. The mitral valve is grossly normal. No evidence of mitral valve  regurgitation.   5. The aortic valve is normal in structure. Aortic valve regurgitation is  not visualized.   Cardiac Catheterization: 02/2023   The left ventricular systolic function is normal.   LV end diastolic pressure is moderately elevated.   The left ventricular ejection fraction is 55-65% by visual estimate.   Normal coronary anatomy Normal LV function Elevated LVEDP 26 mm Hg   Plan: medical management   Physical Exam:   VS:  BP 110/62   Pulse 82   Ht 5\' 11"  (1.803 m)   Wt 270 lb 6.4 oz (122.7 kg)   SpO2 95%   BMI 37.71 kg/m    Wt Readings from Last 3 Encounters:  04/07/23 270 lb 6.4 oz (122.7 kg)  04/02/23 273 lb 12.8 oz (124.2 kg)  03/04/23 270 lb (122.5 kg)     GEN: Well nourished, well developed male appearing in no acute distress NECK: No JVD; No carotid bruits CARDIAC: RRR, no murmurs, rubs, gallops RESPIRATORY:  Clear to auscultation without rales, wheezing or rhonchi  ABDOMEN: Appears non-distended. No obvious abdominal masses. EXTREMITIES: No clubbing or cyanosis. 1+ pitting edema up to mid-shins bilaterally.  Distal pedal pulses are 2+ bilaterally. Radial cath site without ecchymosis or evidence of a hematoma.    Assessment and Plan:  1. History of Chest Pain - Recent cardiac catheterization last month showed normal coronary arteries as discussed above. He denies any recurrent chest pain.  - Continue with risk factor modification.  2. Dyspnea on Exertion/Emphysema - He reports persistent symptoms and does continue to smoke approximately 2 packs/day. Prior CT imaging did show emphysema. Will obtain a Chest CT and refer to Pulmonology based  off results. Will also start diuretic therapy as outlined below given his elevated LVEDP by catheterization.  3. Lower Extremity Edema - He does report intermittent lower extremity edema and has 1+ pitting edema on examination today. Reports symptoms have improved with Lasix in the past. Given his volume on examination along with elevated LVEDP by recent catheterization, will prescribe Lasix 20 mg daily. Recheck BMET in 2 weeks for reassessment of electrolytes and renal function. We also reviewed the importance of limiting his sodium and fluid intake.   4. HTN - BP is well-controlled at 110/62 during today's visit. Continue Amlodipine 5 mg daily. We did review that Clonidine could be playing a role in his fatigue as he has been taking 0.1 mg 3 times daily instead of as needed which is listed on the prescription. I did recommend that he gradually reduce this to twice daily for 2 weeks and then reduce to once daily for an additional 2 weeks and stop afterwards. I encouraged him to follow BP as he may require dose adjustment of Amlodipine.   5. HLD - Followed by his PCP and we will request a copy of his most recent labs. He remains on Atorvastatin 80 mg daily.   Signed, Ellsworth Lennox, PA-C

## 2023-04-07 NOTE — Patient Instructions (Signed)
Medication Instructions:   Would recommend reducing Clonidine to twice daily for 2 weeks. Would then reduce to once daily for 2 weeks. If blood pressure remains well-controlled afterwards, could stop after that.  Start Lasix 20mg  daily.     *If you need a refill on your cardiac medications before your next appointment, please call your pharmacy*   Lab Work:  BMET in 2 weeks.   If you have labs (blood work) drawn today and your tests are completely normal, you will receive your results only by: MyChart Message (if you have MyChart) OR A paper copy in the mail If you have any lab test that is abnormal or we need to change your treatment, we will call you to review the results.   Testing/Procedures:  Chest CT    Follow-Up: At [facility], you and your health needs are our priority.  As part of our continuing mission to provide you with exceptional heart care, we have created designated Provider Care Teams.  These Care Teams include your primary Cardiologist (physician) and Advanced Practice Providers (APPs -  Physician Assistants and Nurse Practitioners) who all work together to provide you with the care you need, when you need it.  We recommend signing up for the patient portal called "MyChart".  Sign up information is provided on this After Visit Summary.  MyChart is used to connect with patients for Virtual Visits (Telemedicine).  Patients are able to view lab/test results, encounter notes, upcoming appointments, etc.  Non-urgent messages can be sent to your provider as well.   To learn more about what you can do with MyChart, go to [email or web address].    Your next appointment:   6 month(s)'  Provider:   You may see Nona Dell, MD or one of the following Advanced Practice Providers on your designated Care Team:   Chilo, PA-C  Jacolyn Reedy, [place]

## 2023-04-10 ENCOUNTER — Ambulatory Visit (HOSPITAL_COMMUNITY)
Admission: RE | Admit: 2023-04-10 | Discharge: 2023-04-10 | Disposition: A | Discharge status: Home / Self Care | Payer: Medicare HMO | Source: Ambulatory Visit | Attending: Gastroenterology | Admitting: Gastroenterology

## 2023-04-10 DIAGNOSIS — R768 Other specified abnormal immunological findings in serum: Secondary | ICD-10-CM | POA: Diagnosis present

## 2023-04-10 DIAGNOSIS — B182 Chronic viral hepatitis C: Secondary | ICD-10-CM | POA: Insufficient documentation

## 2023-04-15 ENCOUNTER — Ambulatory Visit (HOSPITAL_COMMUNITY)
Admission: RE | Admit: 2023-04-15 | Discharge: 2023-04-15 | Disposition: A | Discharge status: Home / Self Care | Payer: Medicare HMO | Source: Ambulatory Visit | Attending: Gastroenterology | Admitting: Gastroenterology

## 2023-04-15 ENCOUNTER — Ambulatory Visit (HOSPITAL_BASED_OUTPATIENT_CLINIC_OR_DEPARTMENT_OTHER): Payer: Medicare HMO | Admitting: Anesthesiology

## 2023-04-15 ENCOUNTER — Encounter (HOSPITAL_COMMUNITY)
Admission: RE | Disposition: A | Discharge status: Home / Self Care | Payer: Self-pay | Source: Ambulatory Visit | Attending: Gastroenterology

## 2023-04-15 ENCOUNTER — Other Ambulatory Visit: Payer: Self-pay

## 2023-04-15 ENCOUNTER — Ambulatory Visit (HOSPITAL_COMMUNITY): Payer: Medicare HMO | Admitting: Anesthesiology

## 2023-04-15 ENCOUNTER — Encounter (HOSPITAL_COMMUNITY): Payer: Self-pay | Admitting: Gastroenterology

## 2023-04-15 DIAGNOSIS — D120 Benign neoplasm of cecum: Secondary | ICD-10-CM | POA: Insufficient documentation

## 2023-04-15 DIAGNOSIS — K5730 Diverticulosis of large intestine without perforation or abscess without bleeding: Secondary | ICD-10-CM | POA: Insufficient documentation

## 2023-04-15 DIAGNOSIS — D122 Benign neoplasm of ascending colon: Secondary | ICD-10-CM | POA: Insufficient documentation

## 2023-04-15 DIAGNOSIS — D124 Benign neoplasm of descending colon: Secondary | ICD-10-CM | POA: Diagnosis not present

## 2023-04-15 DIAGNOSIS — B1920 Unspecified viral hepatitis C without hepatic coma: Secondary | ICD-10-CM | POA: Diagnosis present

## 2023-04-15 DIAGNOSIS — D128 Benign neoplasm of rectum: Secondary | ICD-10-CM | POA: Diagnosis not present

## 2023-04-15 DIAGNOSIS — Z79899 Other long term (current) drug therapy: Secondary | ICD-10-CM | POA: Diagnosis not present

## 2023-04-15 DIAGNOSIS — D123 Benign neoplasm of transverse colon: Secondary | ICD-10-CM | POA: Insufficient documentation

## 2023-04-15 DIAGNOSIS — Z1211 Encounter for screening for malignant neoplasm of colon: Secondary | ICD-10-CM | POA: Diagnosis not present

## 2023-04-15 DIAGNOSIS — I10 Essential (primary) hypertension: Secondary | ICD-10-CM | POA: Insufficient documentation

## 2023-04-15 DIAGNOSIS — E782 Mixed hyperlipidemia: Secondary | ICD-10-CM | POA: Diagnosis not present

## 2023-04-15 DIAGNOSIS — R0789 Other chest pain: Secondary | ICD-10-CM | POA: Diagnosis not present

## 2023-04-15 DIAGNOSIS — K625 Hemorrhage of anus and rectum: Secondary | ICD-10-CM | POA: Insufficient documentation

## 2023-04-15 DIAGNOSIS — R12 Heartburn: Secondary | ICD-10-CM | POA: Diagnosis not present

## 2023-04-15 DIAGNOSIS — K648 Other hemorrhoids: Secondary | ICD-10-CM | POA: Insufficient documentation

## 2023-04-15 HISTORY — DX: Other psychoactive substance abuse, uncomplicated: F19.10

## 2023-04-15 HISTORY — DX: Sleep apnea, unspecified: G47.30

## 2023-04-15 HISTORY — DX: Anxiety disorder, unspecified: F41.9

## 2023-04-15 HISTORY — PX: COLONOSCOPY WITH PROPOFOL: SHX5780

## 2023-04-15 HISTORY — DX: Depression, unspecified: F32.A

## 2023-04-15 HISTORY — PX: POLYPECTOMY: SHX5525

## 2023-04-15 HISTORY — PX: HEMOSTASIS CLIP PLACEMENT: SHX6857

## 2023-04-15 HISTORY — PX: SUBMUCOSAL LIFTING INJECTION: SHX6855

## 2023-04-15 HISTORY — DX: Headache, unspecified: R51.9

## 2023-04-15 LAB — CBC WITH DIFFERENTIAL/PLATELET
Absolute Monocytes: 518 {cells}/uL (ref 200–950)
Basophils Absolute: 67 {cells}/uL (ref 0–200)
Basophils Relative: 0.9 %
Eosinophils Absolute: 118 {cells}/uL (ref 15–500)
Eosinophils Relative: 1.6 %
HCT: 41.8 % (ref 38.5–50.0)
Hemoglobin: 13.9 g/dL (ref 13.2–17.1)
Lymphs Abs: 2375 {cells}/uL (ref 850–3900)
MCH: 30 pg (ref 27.0–33.0)
MCHC: 33.3 g/dL (ref 32.0–36.0)
MCV: 90.1 fL (ref 80.0–100.0)
MPV: 9.8 fL (ref 7.5–12.5)
Monocytes Relative: 7 %
Neutro Abs: 4322 {cells}/uL (ref 1500–7800)
Neutrophils Relative %: 58.4 %
Platelets: 258 10*3/uL (ref 140–400)
RBC: 4.64 10*6/uL (ref 4.20–5.80)
RDW: 12.6 % (ref 11.0–15.0)
Total Lymphocyte: 32.1 %
WBC: 7.4 10*3/uL (ref 3.8–10.8)

## 2023-04-15 LAB — COMPREHENSIVE METABOLIC PANEL
AG Ratio: 1.2 (calc) (ref 1.0–2.5)
ALT: 13 U/L (ref 9–46)
AST: 19 U/L (ref 10–35)
Albumin: 3.8 g/dL (ref 3.6–5.1)
Alkaline phosphatase (APISO): 202 U/L — ABNORMAL HIGH (ref 35–144)
BUN: 10 mg/dL (ref 7–25)
CO2: 28 mmol/L (ref 20–32)
Calcium: 9.2 mg/dL (ref 8.6–10.3)
Chloride: 103 mmol/L (ref 98–110)
Creat: 0.92 mg/dL (ref 0.70–1.30)
Globulin: 3.2 g/dL (ref 1.9–3.7)
Glucose, Bld: 83 mg/dL (ref 65–99)
Potassium: 4.3 mmol/L (ref 3.5–5.3)
Sodium: 139 mmol/L (ref 135–146)
Total Bilirubin: 0.3 mg/dL (ref 0.2–1.2)
Total Protein: 7 g/dL (ref 6.1–8.1)

## 2023-04-15 LAB — HCV RNA NS5A DRUG RESISTANCE

## 2023-04-15 LAB — HCV RNA, QUANT REAL-TIME PCR W/REFLEX
HCV RNA, PCR, QN (Log): 7.04 {Log_IU}/mL — ABNORMAL HIGH
HCV RNA, PCR, QN: 10900000 [IU]/mL — ABNORMAL HIGH

## 2023-04-15 LAB — HM COLONOSCOPY

## 2023-04-15 LAB — HEPATITIS B SURFACE ANTIGEN: Hepatitis B Surface Ag: NONREACTIVE

## 2023-04-15 LAB — HIV ANTIBODY (ROUTINE TESTING W REFLEX): HIV 1&2 Ab, 4th Generation: NONREACTIVE

## 2023-04-15 LAB — HEPATITIS B CORE ANTIBODY, TOTAL: Hep B Core Total Ab: NONREACTIVE

## 2023-04-15 LAB — HEPATITIS B E ANTIBODY: Hep B E Ab: NONREACTIVE

## 2023-04-15 LAB — HEPATITIS B SURFACE ANTIBODY,QUALITATIVE: Hep B S Ab: NONREACTIVE

## 2023-04-15 LAB — HCV RNA,LIPA RFLX NS5A DRUG RESIST

## 2023-04-15 LAB — HEPATITIS B DNA, ULTRAQUANTITATIVE, PCR
Hepatitis B DNA: NOT DETECTED [IU]/mL
Hepatitis B virus DNA: NOT DETECTED {Log_IU}/mL

## 2023-04-15 LAB — HEPATITIS B E ANTIGEN: Hep B E Ag: NONREACTIVE

## 2023-04-15 SURGERY — COLONOSCOPY WITH PROPOFOL
Anesthesia: General

## 2023-04-15 MED ORDER — EPHEDRINE SULFATE (PRESSORS) 50 MG/ML IJ SOLN
INTRAMUSCULAR | Status: DC | PRN
Start: 2023-04-15 — End: 2023-04-15
  Administered 2023-04-15 (×3): 5 mg via INTRAVENOUS

## 2023-04-15 MED ORDER — PROPOFOL 500 MG/50ML IV EMUL
INTRAVENOUS | Status: DC | PRN
  Administered 2023-04-15: 150 ug/kg/min via INTRAVENOUS
  Administered 2023-04-15: 225 ug/kg/min via INTRAVENOUS

## 2023-04-15 MED ORDER — LACTATED RINGERS IV SOLN: INTRAVENOUS | Status: DC | PRN

## 2023-04-15 MED ORDER — LIDOCAINE HCL (CARDIAC) PF 100 MG/5ML IV SOSY
PREFILLED_SYRINGE | INTRAVENOUS | Status: DC | PRN
Start: 2023-04-15 — End: 2023-04-15
  Administered 2023-04-15: 60 mg via INTRATRACHEAL

## 2023-04-15 MED ORDER — PROPOFOL 10 MG/ML IV BOLUS
INTRAVENOUS | Status: DC | PRN
  Administered 2023-04-15: 100 mg via INTRAVENOUS

## 2023-04-15 MED ORDER — LACTATED RINGERS IV SOLN: INTRAVENOUS | Status: DC

## 2023-04-15 MED ORDER — PHENYLEPHRINE HCL (PRESSORS) 10 MG/ML IV SOLN
INTRAVENOUS | Status: DC | PRN
Start: 2023-04-15 — End: 2023-04-15
  Administered 2023-04-15: 80 ug via INTRAVENOUS
  Administered 2023-04-15 (×3): 160 ug via INTRAVENOUS
  Administered 2023-04-15: 240 ug via INTRAVENOUS

## 2023-04-15 NOTE — Interval H&P Note (Signed)
History and Physical Interval Note:  04/15/2023 8:13 AM  Jerome Camacho  has presented today for surgery, with the diagnosis of screening.  The various methods of treatment have been discussed with the patient and family. After consideration of risks, benefits and other options for treatment, the patient has consented to  Procedure(s) with comments: COLONOSCOPY WITH PROPOFOL (N/A) - 9:45 am, asa 1/2 as a surgical intervention.  The patient's history has been reviewed, patient examined, no change in status, stable for surgery.  I have reviewed the patient's chart and labs.  Questions were answered to the patient's satisfaction.     Katrinka Blazing [place]

## 2023-04-15 NOTE — Anesthesia Postprocedure Evaluation (Signed)
Anesthesia Post Note  Patient: Jerome Camacho  Procedure(s) Performed: COLONOSCOPY WITH PROPOFOL POLYPECTOMY SUBMUCOSAL LIFTING INJECTION HEMOSTASIS CLIP PLACEMENT  Patient location during evaluation: Phase II Anesthesia Type: General Level of consciousness: awake and alert and oriented Pain management: pain level controlled Vital Signs Assessment: post-procedure vital signs reviewed and stable Respiratory status: spontaneous breathing, nonlabored ventilation and respiratory function stable Cardiovascular status: blood pressure returned to baseline and stable Postop Assessment: no apparent nausea or vomiting Anesthetic complications: no  No notable events documented.   Last Vitals:  Vitals:   04/15/23 0824 04/15/23 1028  BP: (!) 140/83 92/67  Pulse: 85 68  Resp: 14 17  Temp: 36.8 C (!) 36.1 C  SpO2: 96% 95%    Last Pain:  Vitals:   04/15/23 1028  TempSrc: Axillary  PainSc: 0-No pain                 Josejulian Tarango C Kalab Camps

## 2023-04-15 NOTE — Op Note (Signed)
[facility] Patient Name: Jerome Camacho Procedure Date: 04/15/2023 10:30 AM MRN: [id number] Date of Birth: 1966/07/16 Attending MD: Katrinka Blazing , , [id number] CSN: [id number] Age: 57 Admit Type: Outpatient Procedure:                Colonoscopy Indications:              Screening for colorectal malignant neoplasm Providers:                Katrinka Blazing, Edrick Kins, RN, Zena Amos Referring MD:              Medicines:                Monitored Anesthesia Care Complications:            No immediate complications. Estimated Blood Loss:     Estimated blood loss: none. Procedure:                Pre-Anesthesia Assessment:                           - Prior to the procedure, a History and Physical                            was performed, and patient medications, allergies                            and sensitivities were reviewed. The patient's                            tolerance of previous anesthesia was reviewed.                           - The risks and benefits of the procedure and the                            sedation options and risks were discussed with the                            patient. All questions were answered and informed                            consent was obtained.                           - ASA Grade Assessment: I - A normal, healthy                            patient.                           After obtaining informed consent, the colonoscope                            was passed under direct vision. Throughout the                            procedure, the patient's blood pressure, pulse,  and                            oxygen saturations were monitored continuously. The                            PCF-HQ190L ([id number]) scope was introduced through                            the anus and advanced to the the terminal ileum.                            The colonoscopy was performed without difficulty.                            The patient  tolerated the procedure well. The                            quality of the bowel preparation was excellent. Scope In: 10:42:12 AM Scope Out: 10:58:10 AM Scope Withdrawal Time: 0 hours 12 minutes 42 seconds  Total Procedure Duration: 0 hours 15 minutes 58 seconds  Findings:      The perianal and digital rectal examinations were normal.      Twelve sessile polyps were found in the transverse colon, ascending       colon and cecum. The polyps were 2 to 10 mm in size. These polyps were       removed with a cold snare. Resection and retrieval were complete.      A 12 mm polyp was found in the distal transverse colon. The polyp was       sessile. Area was successfully injected with 1 mL Eleview for a lift       polypectomy. Imaging was performed using white light and narrow band       imaging to visualize the mucosa and demarcate the polyp site after       injection for EMR purposes. The polyp was removed with a cold snare.       Resection and retrieval were complete.      Six sessile polyps were found in the rectum and descending colon. The       polyps were 3 to 12 mm in size. These polyps were removed with a cold       snare. Resection and retrieval were complete.      A 15 mm polyp was found in the rectum. The polyp was flat. Area was       successfully injected with 1 mL Eleview for a lift polypectomy. Imaging       was performed using white light and narrow band imaging to visualize the       mucosa and demarcate the polyp site after injection for EMR purposes.       The polyp was removed with a piecemeal technique using a cold snare.       Resection and retrieval were complete. To close a defect after       polypectomy, three hemostatic clips were successfully placed. Clip       manufacturer: [employer]. There was no bleeding at the end of the       procedure.      The retroflexed  view of the distal rectum and anal verge was normal and       showed no anal or rectal  abnormalities. Impression:               - Twelve 2 to 10 mm polyps in the transverse colon,                            in the ascending colon and in the cecum, removed                            with a cold snare. Resected and retrieved.                           - One 12 mm polyp in the distal transverse colon,                            removed with a cold snare. Resected and retrieved                            via EMR.                           - Six 3 to 12 mm polyps in the rectum and in the                            descending colon, removed with a cold snare.                            Resected and retrieved.                           - One 15 mm polyp in the rectum, removed piecemeal                            using a cold snare. Resected and retrieved via EMR.                            Clips were placed. Clip manufacturer: [employer].                           - The distal rectum and anal verge are normal on                            retroflexion view. Moderate Sedation:      Per Anesthesia Care Recommendation:           - Discharge patient to home (ambulatory).                           - Resume previous diet.                           -  Await pathology results.                           - Repeat colonoscopy in 1 year for surveillance.                           - Referral for genetic counseling due to polyposis                           - Will start treatment with Epclusa once                            elastography result is back. Lab results and plan                            were was discussed with patient. Procedure Code(s):        --- Professional ---                           [phone], Colonoscopy, flexible; with removal of                            tumor(s), polyp(s), or other lesion(s) by snare                            technique                           45380, 59, Colonoscopy, flexible; with biopsy,                             single or multiple Diagnosis Code(s):        --- Professional ---                           K64.8, Other hemorrhoids                           D12.2, Benign neoplasm of ascending colon                           D12.8, Benign neoplasm of rectum                           K62.5, Hemorrhage of anus and rectum                           K57.30, Diverticulosis of large intestine without                            perforation or abscess without bleeding CPT copyright 2022 American Medical Association. All rights reserved. The codes documented in this report are preliminary and upon coder review may  be revised to meet current compliance requirements. Katrinka Blazing, MD Katrinka Blazing,  04/15/2023 11:07:19 AM This report has been signed electronically. Number of Addenda: 0

## 2023-04-15 NOTE — Transfer of Care (Signed)
Immediate Anesthesia Transfer of Care Note  Patient: Jerome Camacho  Procedure(s) Performed: COLONOSCOPY WITH PROPOFOL POLYPECTOMY SUBMUCOSAL LIFTING INJECTION HEMOSTASIS CLIP PLACEMENT  Patient Location: Endoscopy Unit  Anesthesia Type:General  Level of Consciousness: drowsy  Airway & Oxygen Therapy: Patient Spontanous Breathing  Post-op Assessment: Report given to RN and Post -op Vital signs reviewed and stable  Post vital signs: Reviewed and stable  Last Vitals:  Vitals Value Taken Time  BP 92/67 04/15/23 1028  Temp 36.1 C 04/15/23 1028  Pulse 68 04/15/23 1028  Resp 17 04/15/23 1028  SpO2 95 % 04/15/23 1028    Last Pain:  Vitals:   04/15/23 1028  TempSrc: Axillary  PainSc: 0-No pain      Patients Stated Pain Goal: 5 (04/15/23 0824)  Complications: No notable events documented.

## 2023-04-15 NOTE — Anesthesia Preprocedure Evaluation (Addendum)
Anesthesia Evaluation  Patient identified by MRN, date of birth, ID band Patient awake    Reviewed: Allergy & Precautions, H&P , NPO status , Patient's Chart, lab work & pertinent test results  Airway Mallampati: III  TM Distance: >3 FB Neck ROM: Full    Dental  (+) Upper Dentures, Edentulous Lower   Pulmonary sleep apnea , Current Smoker   Pulmonary exam normal breath sounds clear to auscultation       Cardiovascular Exercise Tolerance: Good hypertension, Pt. on medications Normal cardiovascular exam Rhythm:Regular Rate:Normal     Neuro/Psych  Headaches PSYCHIATRIC DISORDERS Anxiety Depression       GI/Hepatic ,GERD  Medicated and Controlled,,(+)     substance abuse  marijuana use, Hepatitis -, B, C  Endo/Other  negative endocrine ROS    Renal/GU negative Renal ROS  negative genitourinary   Musculoskeletal negative musculoskeletal ROS (+)    Abdominal   Peds negative pediatric ROS (+)  Hematology negative hematology ROS (+)   Anesthesia Other Findings   Reproductive/Obstetrics negative OB ROS                             Anesthesia Physical Anesthesia Plan  ASA: 2  Anesthesia Plan: General   Post-op Pain Management: Minimal or no pain anticipated   Induction: Intravenous  PONV Risk Score and Plan: 1 and Propofol infusion  Airway Management Planned: Nasal Cannula and Natural Airway  Additional Equipment:   Intra-op Plan:   Post-operative Plan:   Informed Consent: I have reviewed the patients History and Physical, chart, labs and discussed the procedure including the risks, benefits and alternatives for the proposed anesthesia with the patient or authorized representative who has indicated his/her understanding and acceptance.     Dental advisory given  Plan Discussed with: CRNA and Surgeon  Anesthesia Plan Comments:        Anesthesia Quick Evaluation

## 2023-04-15 NOTE — Discharge Instructions (Addendum)
You are being discharged to home.  Resume your previous diet.  We are waiting for your pathology results.  Your physician has recommended a repeat colonoscopy in one year for surveillance.  Referral to genetic counseling given colonic polyposis. Will start treatment with Epclusa once elastography result is back. Lab results and plan were was discussed with patient.

## 2023-04-16 ENCOUNTER — Encounter (INDEPENDENT_AMBULATORY_CARE_PROVIDER_SITE_OTHER): Payer: Self-pay | Admitting: *Deleted

## 2023-04-16 LAB — SURGICAL PATHOLOGY

## 2023-04-16 NOTE — Op Note (Signed)
[facility] Patient Name: Jerome Camacho Procedure Date: 04/16/2023 12:33 PM MRN: [id number] Date of Birth: 10-Jul-1966 Attending MD: Katrinka Blazing , , [id number] CSN: [id number] Age: 57 Admit Type: Outpatient Procedure:                Colonoscopy Indications:              Screening for colorectal malignant neoplasm Providers:                Katrinka Blazing, Edrick Kins, RN, Zena Amos Referring MD:              Medicines:                Monitored Anesthesia Care Complications:            No immediate complications. Estimated Blood Loss:     Estimated blood loss: none. Procedure:                Pre-Anesthesia Assessment:                           - Prior to the procedure, a History and Physical                            was performed, and patient medications, allergies                            and sensitivities were reviewed. The patient's                            tolerance of previous anesthesia was reviewed.                           - The risks and benefits of the procedure and the                            sedation options and risks were discussed with the                            patient. All questions were answered and informed                            consent was obtained.                           - ASA Grade Assessment: II - A patient with mild                            systemic disease.                           After obtaining informed consent, the colonoscope                            was passed under direct vision. Throughout the                            procedure, the patient's blood  pressure, pulse, and                            oxygen saturations were monitored continuously. The                            PCF-HQ190L ([id number]) scope was introduced through                            the anus and advanced to the the cecum, identified                            by appendiceal orifice and ileocecal valve. The                             colonoscopy was performed without difficulty. The                            patient tolerated the procedure well. The quality                            of the bowel preparation was adequate. Scope In: Scope Out: Findings:      The perianal and digital rectal examinations were normal.      Twelve sessile polyps were found in the transverse colon, ascending       colon and cecum. The polyps were 2 to 10 mm in size. These polyps were       removed with a cold snare. Resection and retrieval were complete.      A 12 mm polyp was found in the transverse colon. The polyp was       semi-sessile. Area was successfully injected with 1 mL Eleview for a       lift polypectomy. Imaging was performed using white light and narrow       band imaging to visualize the mucosa and demarcate the polyp site after       injection for EMR purposes. The polyp was removed with a cold snare.       Resection and retrieval were complete.      Six sessile polyps were found in the rectum and descending colon. The       polyps were 3 to 12 mm in size. These polyps were removed with a cold       snare. Resection and retrieval were complete.      A 15 mm polyp was found in the descending colon. The polyp was flat.       Area was successfully injected with 1 mL Eleview for a lift polypectomy.       Imaging was performed using white light and narrow band imaging to       visualize the mucosa and demarcate the polyp site after injection for       EMR purposes. The polyp was removed with a cold snare. Resection and       retrieval were complete. To prevent bleeding after the polypectomy,       three hemostatic clips were successfully placed. Clip manufacturer:       [employer]. There was no bleeding at the end of the procedure.      The  retroflexed view of the distal rectum and anal verge was normal and       showed no anal or rectal abnormalities. Impression:               - Twelve 2 to 10 mm polyps in the  transverse colon,                            in the ascending colon and in the cecum, removed                            with a cold snare. Resected and retrieved.                           - One 12 mm polyp in the transverse colon, removed                            with a cold snare. Resected and retrieved. Injected.                           - Six 3 to 12 mm polyps in the rectum and in the                            descending colon, removed with a cold snare.                            Resected and retrieved.                           - One 15 mm polyp in the descending colon, removed                            with a cold snare. Resected and retrieved.                            Injected. Clips were placed. Clip manufacturer:                            [employer].                           - The distal rectum and anal verge are normal on                            retroflexion view. Moderate Sedation:      Per Anesthesia Care Recommendation:           - Discharge patient to home (ambulatory).                           - Resume previous diet.                           - Await pathology results.                           -  Repeat colonoscopy in 1 year for surveillance. Procedure Code(s):        --- Professional ---                           [phone], 59, Colonoscopy, flexible; with removal of                            tumor(s), polyp(s), or other lesion(s) by snare                            technique                           45381, Colonoscopy, flexible; with directed                            submucosal injection(s), any substance Diagnosis Code(s):        --- Professional ---                           Z12.11, Encounter for screening for malignant                            neoplasm of colon                           D12.3, Benign neoplasm of transverse colon (hepatic                            flexure or splenic flexure)                           D12.2, Benign neoplasm of  ascending colon                           D12.0, Benign neoplasm of cecum                           D12.8, Benign neoplasm of rectum                           D12.4, Benign neoplasm of descending colon CPT copyright 2022 American Medical Association. All rights reserved. The codes documented in this report are preliminary and upon coder review may  be revised to meet current compliance requirements. Katrinka Blazing, MD Katrinka Blazing,  04/16/2023 1:02:51 PM This report has been signed electronically. Number of Addenda: 0

## 2023-04-20 ENCOUNTER — Other Ambulatory Visit (INDEPENDENT_AMBULATORY_CARE_PROVIDER_SITE_OTHER): Payer: Self-pay

## 2023-04-20 DIAGNOSIS — B182 Chronic viral hepatitis C: Secondary | ICD-10-CM

## 2023-04-22 ENCOUNTER — Encounter (HOSPITAL_COMMUNITY): Payer: Self-pay | Admitting: Gastroenterology

## 2023-04-24 ENCOUNTER — Telehealth: Payer: Self-pay | Admitting: Genetic Counselor

## 2023-04-24 NOTE — Telephone Encounter (Signed)
Left message for patient to call back to schedule genetic counseling appointment per 9/4 inbasket message.

## 2023-04-27 ENCOUNTER — Encounter (INDEPENDENT_AMBULATORY_CARE_PROVIDER_SITE_OTHER): Payer: Self-pay

## 2023-05-15 ENCOUNTER — Ambulatory Visit (HOSPITAL_COMMUNITY): Payer: Medicare HMO

## 2023-06-02 ENCOUNTER — Encounter (INDEPENDENT_AMBULATORY_CARE_PROVIDER_SITE_OTHER): Payer: Self-pay | Admitting: Gastroenterology

## 2023-06-02 ENCOUNTER — Other Ambulatory Visit (INDEPENDENT_AMBULATORY_CARE_PROVIDER_SITE_OTHER): Payer: Self-pay

## 2023-06-02 ENCOUNTER — Other Ambulatory Visit (INDEPENDENT_AMBULATORY_CARE_PROVIDER_SITE_OTHER): Payer: Self-pay | Admitting: *Deleted

## 2023-06-02 ENCOUNTER — Ambulatory Visit (INDEPENDENT_AMBULATORY_CARE_PROVIDER_SITE_OTHER): Payer: Medicare HMO | Admitting: Gastroenterology

## 2023-06-02 VITALS — BP 107/59 | HR 96 | Temp 98.0°F | Ht 72.0 in | Wt 270.9 lb

## 2023-06-02 DIAGNOSIS — B182 Chronic viral hepatitis C: Secondary | ICD-10-CM

## 2023-06-02 NOTE — Progress Notes (Signed)
Referring Provider: Alvina Filbert, MD Primary Care Physician:  Alvina Filbert, MD Primary GI Physician: Dr. Levon Hedger   Chief Complaint  Patient presents with   Follow-up   HPI:   Jerome Camacho is a 57 y.o. male with past medical history of opiate and drug abuse, hepatitis C, hyperlipidemia, hypertension   Patient presenting today for follow up of Hepatitis C.  Last seen August 2024, at that time recently diagnosed with hepatitis C and was unclear if he may also have hepatitis B.  Patient reported history of IV drug use and blood transfusion back in the 90s, no drug use x 4 years currently on Suboxone.  Taking Prilosec occasionally when he has heartburn.  He was recommended to have CBC, CMP, hep B surface antigen and antibody, hep BeAg/Ab, viral load, HCV viral load, HIV testing as well as liver elastography and colonoscopy.  Hepatitis B testing was negative but Hepatitis C testings showed HCV RNA 10,900,000/HCV RNA PCR 7.04, genotype 1a  Elastography showed median kPa 16.1  Stable 1.3 cm cyst in the right hepatic lobe. No other liver lesionsidentified. Within normal limits in parenchymal echogenicity. Portal vein is patent on color Doppler imaging with normal direction of blood flow towards the liver.   Unfortunately elastography quality was low, patient recommended to have Fibrosure testing prior to starting Epclusa to rule out underlying cirrhosis.   Fibrosure testing not yet completed  Present: Sates he is going to get fibrosure testing after he leaves here today. Feeling more weak/fatigued recently. He has sob at baseline due to smoking, is interested in trying to quit. Denies rectal bleeding or melena. He reports he eats a lot of junk, may sometimes go a day or two without eating much. Can go up to a week without a BM. Does not take anything for constipation. Weight is stable. Appetite is not great. No early satiety. No nausea or vomiting. He notes that he has a lot of  anxiety and some depression, history of the same. He is not currently on therapy. Has been on xanax/valium in the past but does not wish to take these again. He has no GI complaints today.   We did discuss results of colonoscopy and liver elastography, previously performed.   Last Colonoscopy:-04/2023 Twelve 2 to 10 mm polyps in the transverse colon,                            in the ascending colon and in the cecum, removed                            with a cold snare. Resected and retrieved.                           - One 12 mm polyp in the transverse colon, removed                            with a cold snare. Resected and retrieved. Injected.                           - Six 3 to 12 mm polyps in the rectum and in the  descending colon, removed with a cold snare.                            Resected and retrieved.                           - One 15 mm polyp in the descending colon, removed                            with a cold snare. Resected and retrieved.                            Injected. Clips were placed. Clip manufacturer:                            [employer].                           - The distal rectum and anal verge are normal on                            retroflexion view.  Last Endoscopy: never   Recommendations:  Repeat colonoscopy in 1 year   Past Medical History:  Diagnosis Date   Anxiety    Depression    Essential hypertension    Headache    Hepatitis C    History of cardiac cath    a. 02/2023: cath showing normal cors with normal LV function. LVEDP elevated at 26 mmHg.   History of substance abuse (HCC)    Previously oxycodone and then IV heroin - follows with counselor and has been clean for the last 4 years   Mixed hyperlipidemia    Sleep apnea    Substance abuse (HCC)     Past Surgical History:  Procedure Laterality Date   APPENDECTOMY     BACK SURGERY     COLONOSCOPY WITH PROPOFOL N/A 04/15/2023   Procedure:  COLONOSCOPY WITH PROPOFOL;  Surgeon: Dolores Frame, MD;  [facility];  Service: Gastroenterology;  Laterality: N/A;  9:45 am, asa 1/2   HEMOSTASIS CLIP PLACEMENT  04/15/2023   Procedure: HEMOSTASIS CLIP PLACEMENT;  Surgeon: Dolores Frame, MD;  Location: [facility];  Service: Gastroenterology;;   LEFT HEART CATH AND CORONARY ANGIOGRAPHY N/A 03/04/2023   Procedure: LEFT HEART CATH AND CORONARY ANGIOGRAPHY;  Surgeon: [place]az[place], Peter M, MD;  Location: [facility] INVASIVE CV LAB;  Service: Cardiovascular;  Laterality: N/A;   POLYPECTOMY  04/15/2023   Procedure: POLYPECTOMY;  Surgeon: Dolores Frame, MD;  [facility]: [facility];  Service: Gastroenterology;;   SUBMUCOSAL LIFTING INJECTION  04/15/2023   Procedure: SUBMUCOSAL LIFTING INJECTION;  Surgeon: Marguerita Merles, Reuel Boom, MD;  Location: [facility];  Service: Gastroenterology;;    Current Outpatient Medications  Medication Sig Dispense Refill   acetaminophen (TYLENOL) 500 MG tablet Take 1,500 mg by mouth every 6 (six) hours as needed for moderate pain.     amLODipine (NORVASC) 5 MG tablet Take 5 mg by mouth daily.     aspirin EC 81 MG tablet Take 81 mg by mouth daily. Swallow whole.     atorvastatin (LIPITOR) 80 MG tablet Take 80 mg by mouth daily.  Buprenorphine HCl-Naloxone HCl (SUBOXONE) 8-2 MG FILM Place 0.5 Film under the tongue 4 (four) times daily.     carbamazepine (TEGRETOL) 200 MG tablet Take 200 mg by mouth 2 (two) times daily.     cloNIDine (CATAPRES) 0.1 MG tablet Take 0.1 mg by mouth 3 (three) times daily as needed (High BP/ Anxiety).     cyclobenzaprine (FLEXERIL) 10 MG tablet Take 10 mg by mouth 3 (three) times daily.     fluticasone (FLONASE) 50 MCG/ACT nasal spray Place 1 spray into both nostrils daily as needed for allergies.      furosemide (LASIX) 20 MG tablet Take 1 tablet (20 mg total) by mouth daily. 90 tablet 3   gabapentin (NEURONTIN) 300 MG capsule Take 900 mg by mouth 3 (three)  times daily.      hydrOXYzine (VISTARIL) 50 MG capsule Take 50 mg by mouth 3 (three) times daily as needed for anxiety.     QUEtiapine (SEROQUEL) 100 MG tablet Take 100 mg by mouth at bedtime.     QUEtiapine (SEROQUEL) 25 MG tablet Take 50 mg by mouth 2 (two) times daily as needed (agitation).     tamsulosin (FLOMAX) 0.4 MG CAPS capsule Take 0.8 mg by mouth daily after breakfast.     No current facility-administered medications for this visit.    Allergies as of 06/02/2023 - Review Complete 04/15/2023  Allergen Reaction Noted   Nsaids Other (See Comments) 02/28/2015   Cymbalta [duloxetine hcl] Other (See Comments) 02/28/2015    Family History  Problem Relation Age of Onset   Arrhythmia Mother    Hypertension Mother    Glaucoma Mother    Stroke Father    Hyperlipidemia Father    Hypertension Father    Diabetes Father    Coronary artery disease Father     Social History   Socioeconomic History   Marital status: Married    Spouse name: Not on file   Number of children: Not on file   Years of education: Not on file   Highest education level: Not on file  Occupational History   Not on file  Tobacco Use   Smoking status: Heavy Smoker    Current packs/day: 4.00    Types: Cigarettes   Smokeless tobacco: Never  Vaping Use   Vaping status: Never Used  Substance and Sexual Activity   Alcohol use: No   Drug use: Yes    Types: IV, Marijuana   Sexual activity: Never  Other Topics Concern   Not on file  Social History Narrative   Not on file   Social Determinants of Health   Financial Resource Strain: Not on file  Food Insecurity: Not on file  Transportation Needs: Not on file  Physical Activity: Not on file  Stress: Not on file  Social Connections: Not on file    Review of systems General: negative for malaise, night sweats, fever, chills, weight loss Neck: Negative for lumps, goiter, pain and significant neck swelling Resp: Negative for cough, wheezing, +SOB   CV: Negative for chest pain, leg swelling, palpitations, orthopnea GI: denies melena, hematochezia, nausea, vomiting, diarrhea, constipation, dysphagia, odyonophagia, early satiety or unintentional weight loss.  MSK: Negative for joint pain or swelling, back pain, and muscle pain. Derm: Negative for itching or rash Psych: +depression, +anxiety No homicidal or suicidal ideation.  Neuro: negative for tremor, gait imbalance, syncope and seizures. The remainder of the review of systems is noncontributory.  Physical Exam: There were no vitals taken for this visit.  General:   Alert and oriented. No distress noted. Pleasant and cooperative.  Head:  Normocephalic and atraumatic. Eyes:  Conjuctiva clear without scleral icterus. Mouth:  Oral mucosa pink and moist. Good dentition. No lesions. Heart: Normal rate and rhythm, s1 and s2 heart sounds present.  Lungs: Clear lung sounds in all lobes. Respirations equal and unlabored. Abdomen:  +BS, soft, non-tender and non-distended. No rebound or guarding. No HSM or masses noted. Neurologic:  Alert and  oriented x4 Psych:  Alert and cooperative. Mildly anxious   Invalid input(s): "6 MONTHS"   ASSESSMENT: MAXIE KLEMP is a 57 y.o. male presenting today for follow up of Hepatitis C  Hepatitis C testing previously indicated active infection, liver elastography was inconclusive, he was recommended to complete fibrosure in hopes of starting on Epclusa thereafter. He was lost to follow up for fibrosure testing, he is planning to do this after his visit today. As long as fibrosure does not indicate advanced fibrosis, will plan to start Epclusa thereafter.  Notably, patient reports a lot of weakness/fatigue, anxiety, depression. Not currently on any therapy for his mental health. Last labs in August with normal hgb, LFTs WNL. He denies any rectal bleeding, melena, nausea, vomiting, weight loss or other GI complaints. He is seeking out a new PCP. I  encouraged him to try and establish with a PCP he feels comfortable with as he will likely benefit from help with smoking cessation/treatment for his anxiety/depression.    PLAN:  Complete fibrosure testing 2. Plan to start Epclusa after fibrosure testing back 3. Encouraged to seek care for anxiety/depression/smoking cessation   All questions were answered, patient verbalized understanding and is in agreement with plan as outlined above.   Follow Up: 3 months   Malyiah Fellows L. Jeanmarie Hubert, MSN, APRN, AGNP-C Adult-Gerontology Nurse Practitioner [facility]

## 2023-06-02 NOTE — Patient Instructions (Signed)
Please proceed to the lab to do fibrosure testing so that we can start treatment for Hepatitis C. We will be in touch once we have this lab result and give further instructions from there. As long as labs look good, we will plan to start you on Epclusa for your Hepatitis.  You will be due for repeat Colonoscopy again next year   Follow up 3 months  It was a pleasure to see you today. I want to create trusting relationships with patients and provide genuine, compassionate, and quality care. I truly value your feedback! please be on the lookout for a survey regarding your visit with me today. I appreciate your input about our visit and your time in completing this!    Destyn Schuyler L. Jeanmarie Hubert, MSN, APRN, AGNP-C Adult-Gerontology Nurse Practitioner [facility] at [facility]

## 2023-06-08 ENCOUNTER — Ambulatory Visit (HOSPITAL_COMMUNITY)
Admission: RE | Admit: 2023-06-08 | Discharge: 2023-06-08 | Disposition: A | Discharge status: Home / Self Care | Payer: Medicare HMO | Source: Ambulatory Visit | Attending: Student | Admitting: Student

## 2023-06-08 DIAGNOSIS — R0609 Other forms of dyspnea: Secondary | ICD-10-CM | POA: Diagnosis present

## 2023-06-08 DIAGNOSIS — J439 Emphysema, unspecified: Secondary | ICD-10-CM | POA: Diagnosis present

## 2023-06-08 LAB — LIVER FIBROSIS, FIBROTEST-ACTITEST
ALT: 11 U/L (ref 9–46)
Alpha-2-Macroglobulin: 242 mg/dL (ref 106–279)
Apolipoprotein A1: 126 mg/dL (ref 94–176)
Bilirubin: 0.3 mg/dL (ref 0.2–1.2)
Fibrosis Score: 0.26
GGT: 40 U/L (ref 3–85)
Haptoglobin: 229 mg/dL — ABNORMAL HIGH (ref 43–212)
Necroinflammat ACT Score: 0.03
Reference ID: 5173459

## 2023-06-09 ENCOUNTER — Other Ambulatory Visit (INDEPENDENT_AMBULATORY_CARE_PROVIDER_SITE_OTHER): Payer: Self-pay

## 2023-06-09 DIAGNOSIS — B182 Chronic viral hepatitis C: Secondary | ICD-10-CM

## 2023-06-09 MED ORDER — SOFOSBUVIR-VELPATASVIR 400-100 MG PO TABS: 1.0000 | ORAL_TABLET | Freq: Every day | ORAL | 2 refills | Status: DC

## 2023-06-16 ENCOUNTER — Telehealth (INDEPENDENT_AMBULATORY_CARE_PROVIDER_SITE_OTHER): Payer: Self-pay | Admitting: *Deleted

## 2023-06-16 NOTE — Telephone Encounter (Signed)
  Jerome Camacho, [facility] 06/16/2023  2:34 PM EST     Tried calling no answer. I have left a message on vm asked that the patient please return call to the office.

## 2023-06-16 NOTE — Telephone Encounter (Signed)
So, the patient may not be able to afford this as it is a co pay of over 1,000 dollars. I am trying to reach the patient to see if he can afford or if interested in patient assistance.

## 2023-06-16 NOTE — Telephone Encounter (Signed)
Hi everyone, Can we ask the patietn to bring a list of the medications he is currently taking? I do not see carbamazepine in his most recent meds but he was taking it before, it seems. Please ask him to bring a list of the medications. Also ask him why he is taking carbamazepine, if he is currently taking it.  Let's hold off on other steps on this medication until we can clarify this Thanks

## 2023-06-16 NOTE — Telephone Encounter (Signed)
Pharmacist Hilda Lias at [facility] called because they received rx for epclusa and they have a few medications that have interaction with this med. Wanted a call bacl at [phone] ext [phone]

## 2023-06-16 NOTE — Telephone Encounter (Signed)
Called [facility] and was told there are interactions with taking epculsa with atorvastatin 80mg  ( recommended decreasing to 40mg  while on med or put med on hold ) and also carbamazepine ( pharmacist states it is a washout period of 5 -14 days while on epculsa)   Please advise

## 2023-06-22 ENCOUNTER — Other Ambulatory Visit (INDEPENDENT_AMBULATORY_CARE_PROVIDER_SITE_OTHER): Payer: Self-pay

## 2023-06-22 NOTE — Telephone Encounter (Signed)
Mailed a letter to the patient asking him to please call the office .

## 2023-06-23 ENCOUNTER — Encounter (INDEPENDENT_AMBULATORY_CARE_PROVIDER_SITE_OTHER): Payer: Self-pay

## 2023-06-23 NOTE — Telephone Encounter (Signed)
I sent a My Chart message saying,  We have been trying to reach you by phone and mail. Please call the office at [phone].    Thanks

## 2023-06-25 NOTE — Telephone Encounter (Signed)
Wife called and said she wanted to get results of labs. She is not on released I called her back and told her to have him call us.   [phone] wife's number

## 2023-06-29 NOTE — Telephone Encounter (Signed)
I spoke with the patient today he says he is no longer on carbamazepine:  He is taking  Clonidine 0.1 mg tid Suboxone 0.5 QID Gabapentin 600 mg tid Amlodipine 5 mg daily Asprin 81 mg daily Atorvastatin 5 mg every day Cyclobenzaprine 10 mg tid Flonase daily prn Lasix 20 mg daily Seroquel 100 mg at bedtime Seroquel 50 bid prn. Patient states these are the only medications he is on.   Please advise if ok to proceed with Hep C treatment. If ok will need to reach out to [facility] with [facility] or the pharmacist there that ok to proceed as patient no longer on Carbamazepine. They will need to proceed with patient assistance at that time as the patient says he can not afford the 1000 dollar co payment. Patient made aware to answer unknown phone numbers as this may be [facility] also known as [facility]. Patient states understanding. Thanks

## 2023-06-30 ENCOUNTER — Telehealth: Payer: Self-pay | Admitting: Cardiology

## 2023-06-30 ENCOUNTER — Telehealth: Payer: Self-pay | Admitting: *Deleted

## 2023-06-30 DIAGNOSIS — R0609 Other forms of dyspnea: Secondary | ICD-10-CM

## 2023-06-30 NOTE — Telephone Encounter (Signed)
Wife notified that there is not a DPR on file and that we will need to speak with pt concerning results.

## 2023-06-30 NOTE — Telephone Encounter (Signed)
-----   Message from [place] sent at 06/23/2023 12:24 PM EST ----- Please let the patient I apologize for the delay as his Chest CT from 05/2023 was just read by the Radiologist. This showed no acute cardiopulmonary abnormalities but he was noted to have moderate centrilobular and parasternal emphysematous changes. Would recommend referral to [facility] for further management of emphysema if still having dyspnea on exertion.

## 2023-06-30 NOTE — Telephone Encounter (Signed)
Thanks for the update. Patient can proceed with Epclusa for 12 weeks and patient assistance program. If not qualifying, we will need to evaluate treating with [facility]. Please reach [facility]. Needs CMP and viral load after finishing treatment, and repeat testing 3 months after.  Please ask the patient to stop using atorvastatin for the 12 weeks he will be taking Epclusa. Can restart the medication after finishing Epclusa.

## 2023-06-30 NOTE — Telephone Encounter (Signed)
Pt and wife notified of test results

## 2023-06-30 NOTE — Telephone Encounter (Signed)
Patient's wife returned call with patient on the line.

## 2023-06-30 NOTE — Telephone Encounter (Signed)
Pt wife called in returning call to nurse for CT results from last week    Best number [phone]

## 2023-07-01 NOTE — Telephone Encounter (Signed)
Please ask the patient to stop using atorvastatin for the 12 weeks he will be taking Epclusa. Can restart the medication after finishing Epclusa.   Needs CMP and viral load after finishing treatment, and repeat testing 3 months after.

## 2023-07-01 NOTE — Telephone Encounter (Signed)
Please seen note between [facility] with [facility] and me below.   We never reached him so no he was not referred but I can send you the paperwork he needs to complete if you would like. If anything at this point he should wait tell next year if he wants to pay as if he started now it would be the $1400 then it would restart in January with deductible and have $3000 copay all over again. At least if he waits it will be one time he pays it..       This message was sent securely by [facility] Lysbeth Penner, I was just checking in on this patient for his Epclusa. Were y'all able to reach him regarding the patient assistance for Epculsa Patient assistance. So the drug interaction with Carbamazepine   This message was sent securely by [facility].      Lysbeth Penner,   I was just checking in on this patient for his Epclusa. Were y'all able to reach him regarding the patient assistance for Epculsa Patient assistance. So the drug interaction with Carbamazepine he is no longer taking  the Carbamazepine and we are going to ask the patient hold the Atorvastatin for the 12 weeks he is on the Hep C medication.      please advise, Thanks       Jerome Camacho

## 2023-07-01 NOTE — Telephone Encounter (Signed)
What about the atorvastatin? There was an interaction also on this medication with the Epclusa.   Per [facility] we were told there are interactions with taking epculsa with atorvastatin 80mg  ( recommended decreasing to 40mg  while on med or put med on hold ) and also carbamazepine ( pharmacist states it is a washout period of 5 -14 days while on epculsa)   He is taking Clonidine 0.1 mg tid Suboxone 0.5 QID Gabapentin 600 mg tid Amlodipine 5 mg daily Asprin 81 mg daily Atorvastatin 5 mg every day Cyclobenzaprine 10 mg tid Flonase daily prn Lasix 20 mg daily Seroquel 100 mg at bedtime Seroquel 50 bid prn. Patient states these are the only medications he is on

## 2023-09-07 ENCOUNTER — Encounter (INDEPENDENT_AMBULATORY_CARE_PROVIDER_SITE_OTHER): Payer: Self-pay | Admitting: Gastroenterology

## 2023-09-07 ENCOUNTER — Ambulatory Visit (INDEPENDENT_AMBULATORY_CARE_PROVIDER_SITE_OTHER): Payer: Medicare HMO | Admitting: Gastroenterology

## 2023-09-07 VITALS — BP 100/69 | HR 111 | Temp 96.6°F | Ht 71.0 in | Wt 265.9 lb

## 2023-09-07 DIAGNOSIS — K5909 Other constipation: Secondary | ICD-10-CM | POA: Diagnosis not present

## 2023-09-07 DIAGNOSIS — B182 Chronic viral hepatitis C: Secondary | ICD-10-CM

## 2023-09-07 DIAGNOSIS — K5900 Constipation, unspecified: Secondary | ICD-10-CM

## 2023-09-07 NOTE — Patient Instructions (Signed)
-  will plan to proceed with Epclusa once patient assistance completed/processed -Start taking Miralax 1 capful every day for one week. If bowel movements do not improve, increase to 1 capful every 12 hours. If after two weeks there is no improvement, increase to 1 capful every 8 hours -Increase water intake, aim for atleast 64 oz per day -Increase fruits, veggies and whole grains, kiwi and prunes are especially good for constipation  We will be in touch regarding labs to be done after completion of epclusa  Follow up 6 months  It was a pleasure to see you today. I want to create trusting relationships with patients and provide genuine, compassionate, and quality care. I truly value your feedback! please be on the lookout for a survey regarding your visit with me today. I appreciate your input about our visit and your time in completing this!    Silvina Hackleman L. Jeanmarie Hubert, MSN, APRN, AGNP-C Adult-Gerontology Nurse Practitioner [facility] at [facility]

## 2023-09-07 NOTE — Progress Notes (Unsigned)
Referring Provider: Alvina Filbert, MD Primary Care Physician:  Alvina Filbert, MD Primary GI Physician: Dr. Levon Hedger   Chief Complaint  Patient presents with   Follow-up    Patient here today for a follow up on Hep C. Patient has not started on the Hep C treatment as of yet. He will apply for patient assistance for treatment, which he has brought the forms with him today.   HPI:   Jerome Camacho is a 58 y.o. male with past medical history of  opiate and drug abuse, hepatitis C, hyperlipidemia, hypertension   Patient presenting today for follow up of Hepatitis C  Last seen October 2024, at that time patient had not done FibroSure testing that had previously been recommended prior to starting hepatitis C treatment.  He reported feeling more weak and fatigued recently.  Try to quit smoking.  He denied rectal bleeding or melena.  Noted appetite was not great dealing with a lot of anxiety depression.  No GI complaints.  Patient recommended to complete FibroSure testing, plan to start Epclusa after FibroSure testing, patient encouraged to seek care for anxiety depression/smoking cessation.  FibroSure testing done in October 2024 was normal  Patient was to proceed with Epclusa there was there was concern about him being on carbamazepine/atorvastatin and treatment was delayed due to cost  Present:  Patient states he is doing okay today. He is still smoking. Was started on vyvanse. states he is still feeling somewhat tired though feels this has helped some. He is on his second month of this after his diagnosis of ADD. He notes he is having some constipation since starting vyvanse, he is using fleet suppositories PRN which seem to help some. He does not drink water, drinks a lot of sun drop.  has long history of constipation and previously on miralax. He does eat a lot of citrus fruits. Denies rectal bleeding or melena. He did bring his forms for patient assistance for epclusa.  Last  Colonoscopy:-04/2023 Twelve 2 to 10 mm polyps in the transverse colon,                            in the ascending colon and in the cecum, removed                            with a cold snare. Resected and retrieved.                           - One 12 mm polyp in the transverse colon, removed                            with a cold snare. Resected and retrieved. Injected.                           - Six 3 to 12 mm polyps in the rectum and in the                            descending colon, removed with a cold snare.                            Resected and retrieved.                           -  One 15 mm polyp in the descending colon, removed                            with a cold snare. Resected and retrieved.                            Injected. Clips were placed. Clip manufacturer:                            [employer].                           - The distal rectum and anal verge are normal on                            retroflexion view.   Last Endoscopy: never    Recommendations:  Repeat colonoscopy in 1 year    Past Medical History:  Diagnosis Date   Anxiety    Depression    Essential hypertension    Headache    Hepatitis C    History of cardiac cath    a. 02/2023: cath showing normal cors with normal LV function. LVEDP elevated at 26 mmHg.   History of substance abuse (HCC)    Previously oxycodone and then IV heroin - follows with counselor and has been clean for the last 4 years   Mixed hyperlipidemia    Sleep apnea    Substance abuse (HCC)     Past Surgical History:  Procedure Laterality Date   APPENDECTOMY     BACK SURGERY     COLONOSCOPY WITH PROPOFOL N/A 04/15/2023   Procedure: COLONOSCOPY WITH PROPOFOL;  Surgeon: Dolores Frame, MD;  [facility];  Service: Gastroenterology;  Laterality: N/A;  9:45 am, asa 1/2   HEMOSTASIS CLIP PLACEMENT  04/15/2023   Procedure: HEMOSTASIS CLIP PLACEMENT;  Surgeon: Dolores Frame, MD;   Location: [facility];  Service: Gastroenterology;;   LEFT HEART CATH AND CORONARY ANGIOGRAPHY N/A 03/04/2023   Procedure: LEFT HEART CATH AND CORONARY ANGIOGRAPHY;  Surgeon: Swaziland, Peter M, MD;  Location: [facility] INVASIVE CV LAB;  Service: Cardiovascular;  Laterality: N/A;   POLYPECTOMY  04/15/2023   Procedure: POLYPECTOMY;  Surgeon: Dolores Frame, MD;  [facility]: [facility];  Service: Gastroenterology;;   SUBMUCOSAL LIFTING INJECTION  04/15/2023   Procedure: SUBMUCOSAL LIFTING INJECTION;  Surgeon: Marguerita Merles, Reuel Boom, MD;  Location: [facility];  Service: Gastroenterology;;    Current Outpatient Medications  Medication Sig Dispense Refill   acetaminophen (TYLENOL) 500 MG tablet Take 1,500 mg by mouth every 6 (six) hours as needed for moderate pain.     amLODipine (NORVASC) 5 MG tablet Take 5 mg by mouth daily.     aspirin EC 81 MG tablet Take 81 mg by mouth daily. Swallow whole.     atorvastatin (LIPITOR) 80 MG tablet Take 80 mg by mouth daily.     Buprenorphine HCl-Naloxone HCl (SUBOXONE) 8-2 MG FILM Place 0.5 Film under the tongue 4 (four) times daily.     cloNIDine (CATAPRES) 0.1 MG tablet Take 0.1 mg by mouth 3 (three) times daily as needed (High BP/ Anxiety).     cyclobenzaprine (FLEXERIL) 10 MG tablet Take 10 mg by mouth 3 (three) times daily.  fluticasone (FLONASE) 50 MCG/ACT nasal spray Place 1 spray into both nostrils daily as needed for allergies.      furosemide (LASIX) 20 MG tablet Take 1 tablet (20 mg total) by mouth daily. 90 tablet 3   gabapentin (NEURONTIN) 300 MG capsule Take 900 mg by mouth 3 (three) times daily.      lisdexamfetamine (VYVANSE) 20 MG capsule Take 20 mg by mouth daily.     QUEtiapine (SEROQUEL) 100 MG tablet Take 100 mg by mouth at bedtime.     Sofosbuvir-Velpatasvir (EPCLUSA) 400-100 MG TABS Take 1 tablet by mouth daily. (Patient not taking: Reported on 09/07/2023) 28 tablet 2   No current facility-administered medications for this visit.     Allergies as of 09/07/2023 - Review Complete 09/07/2023  Allergen Reaction Noted   Nsaids Other (See Comments) 02/28/2015   Cymbalta [duloxetine hcl] Other (See Comments) 02/28/2015    Family History  Problem Relation Age of Onset   Arrhythmia Mother    Hypertension Mother    Glaucoma Mother    Stroke Father    Hyperlipidemia Father    Hypertension Father    Diabetes Father    Coronary artery disease Father     Social History   Socioeconomic History   Marital status: Married    Spouse name: Not on file   Number of children: Not on file   Years of education: Not on file   Highest education level: Not on file  Occupational History   Not on file  Tobacco Use   Smoking status: Heavy Smoker    Current packs/day: 4.00    Types: Cigarettes   Smokeless tobacco: Never  Vaping Use   Vaping status: Never Used  Substance and Sexual Activity   Alcohol use: No   Drug use: Yes    Types: IV, Marijuana   Sexual activity: Never  Other Topics Concern   Not on file  Social History Narrative   Not on file   Social Drivers of Health   Financial Resource Strain: Not on file  Food Insecurity: Not on file  Transportation Needs: Not on file  Physical Activity: Not on file  Stress: Not on file  Social Connections: Not on file    Review of systems General: negative for malaise, night sweats, fever, chills, weight loss Neck: Negative for lumps, goiter, pain and significant neck swelling Resp: Negative for cough, wheezing, dyspnea at rest CV: Negative for chest pain, leg swelling, palpitations, orthopnea GI: denies melena, hematochezia, nausea, vomiting, diarrhea, dysphagia, odyonophagia, early satiety or unintentional weight loss. +constipation  Psych: Denies depression, anxiety, memory loss, confusion. No homicidal or suicidal ideation.  The remainder of the review of systems is noncontributory.  Physical Exam: BP 100/69 (BP Location: Left Arm, Patient Position: Sitting,  Cuff Size: Large)   Pulse (!) 111   Temp (!) 96.6 F (35.9 C) (Temporal)   Ht 5\' 11"  (1.803 m)   Wt 265 lb 14.4 oz (120.6 kg)   BMI 37.09 kg/m  General:   Alert and oriented. No distress noted. Pleasant and cooperative.  Head:  Normocephalic and atraumatic. Eyes:  Conjuctiva clear without scleral icterus. Mouth:  Oral mucosa pink and moist. Good dentition. No lesions. Heart: Normal rate and rhythm, s1 and s2 heart sounds present.  Lungs: Clear lung sounds in all lobes. Respirations equal and unlabored. Abdomen:  +BS, soft, non-tender and non-distended. No rebound or guarding. No HSM or masses noted. Derm: No palmar erythema or jaundice Neurologic:  Alert and  oriented x4 Psych:  Alert and cooperative. Normal mood and affect.  Invalid input(s): "6 MONTHS"   ASSESSMENT: DEANGELO BERNS is a 58 y.o. male presenting today for follow up of hepatitis C, also with constipation   Hepatitis C: Previous liver elastography was inconclusive therefore treatment was delayed until patient completed FibroSure testing which she did after his last visit with findings of no fibrosis, he was advised to go ahead and start Epclusa thereafter though he require patient assistance due to cost of the medication and reports they just got their W-2's which they brought today, we will work on patient assistance to get him started on Epclusa.  Constipation: has had chronic constipation the majority of his life, though worse recently since starting Vyvanse, this is a known side effect of the medication.  He reports he does not drink any water but drinks only soda.  Using enemas as needed currently but not on anything routinely for his constipation.  We discussed the importance of increasing water intake, continuing with diet high in fruits, veggies, whole grains, would recommend he start taking MiraLAX to see if this improves his constipation   PLAN:  -proceed with Epclusa once patient assistance completed/approved   -repeat CMP/HCV RNA at conclusion of Epclusa and again 3 months after  -Start taking Miralax 1 capful every day for one week. If bowel movements do not improve, increase to 1 capful every 12 hours. If after two weeks there is no improvement, increase to 1 capful every 8 hours -Increase water intake, aim for atleast 64 oz per day -continue diet high in fruits, veggies and whole grains, kiwi and prunes are especially good for constipation  All questions were answered, patient verbalized understanding and is in agreement with plan as outlined above.    Follow Up: 6 months   Vic Esco L. Jeanmarie Hubert, MSN, APRN, AGNP-C Adult-Gerontology Nurse Practitioner [facility]  I have reviewed the note and agree with the APP's assessment as described in this progress note  Katrinka Blazing, MD Gastroenterology and Hepatology [facility]astroenterology

## 2023-09-08 DIAGNOSIS — K5900 Constipation, unspecified: Secondary | ICD-10-CM | POA: Insufficient documentation

## 2023-09-08 NOTE — Telephone Encounter (Signed)
Patient assistance forms faxed to [employer] patient assistance with income information and medical records.

## 2023-09-08 NOTE — Telephone Encounter (Signed)
-------  Fax Transmission Report-------  To:               Recipient at [phone] Subject:          Patient assistance J. Rothlisberger Result:           The transmission was successful. Explanation:      All Pages Ok Pages Sent:       17 Connect Time:     33 minutes, 36 seconds Transmit Time:    09/08/2023 09:41 Transfer Rate:    14400 Status Code:      0000 Retry Count:      0 Job Id:           [id number] Unique Id:        [id number] Fax Line:         21 Fax Server:       [employer]

## 2023-09-10 ENCOUNTER — Telehealth (INDEPENDENT_AMBULATORY_CARE_PROVIDER_SITE_OTHER): Payer: Self-pay

## 2023-09-10 NOTE — Telephone Encounter (Signed)
I received letter from [facility] path patient assistance stating the patient is not eligible for for patient assistance with them. Patient insurance has changed at the beginning of the year and we will re do a prior authorization through patient insurance to see if the plan cost is lower from last year for the Epculsa, and see if this is something the patient can afford. I have submitted a new authorization 09/10/2023, awaiting determination. ( If this is something he can not afford, I will direct the patient to reach out to Medicare to see if he is qualified for the monthly equally divided payment plans, or the low income subsidy plan.

## 2023-09-10 NOTE — Telephone Encounter (Signed)
I have resubmitted the prior authorization the the current calendar year and the Jerome Camacho was approved for 08/12/2023-12/03/2023. I have called [employer], left a message for Todd. I need him to re run the claim for the Epclusa to see what the current co pay would be for the Epculsa.

## 2023-09-10 NOTE — Telephone Encounter (Signed)
Date: 09/10/2023 Remigio Bing [address] [place], [place] [address] Member Name: DAKARI CREGGER Member ID Number: [id number] Thank you for trusting your Medicare prescription drug coverage to [facility] (PPO). As our member, we want to help you get the most value from your prescription drug coverage and help you understand how your coverage works. As a member of [employer] (PPO), we are pleased to inform you that, upon review of the information provided by you or your doctor, we have approved the requested coverage for the following prescription drug(s): EPCLUSA Tablet Type of coverage approved: Prior Authorization This approval authorizes your coverage from 08/12/2023 - 12/03/2023, unless we notify you otherwise, and as long as the following conditions apply: ? you remain enrolled in our Medicare Part D prescription drug plan, ? your physician or other prescriber continues to prescribe the medication for you, and ? the medication continues to be safe for treating your condition. Depending upon the strength and/or formulation of the drug prescribed by your physician, different quantity limits or safety edits may apply. Please consult your Medicare Part D plan's formulary for the specific quantity limit. If you have not already filled your prescription for this approved drug, you may do so at a participating network pharmacy. Thank you for allowing [place] to serve you. This letter is informational only. No further action is required by you at this time. If you have questions or need help, please talk to your doctor or pharmacist, or contact Customer Care at [phone], 24 hours a day, 7 days a week. TTY users may call [phone]. Thank you, A

## 2023-09-11 NOTE — Telephone Encounter (Signed)
To:               Recipient at [phone] Subject:          J.Spero Geralds Dorita Fray script [facility] (ATTEN: Cristobal Goldmann). Result:           The transmission was successful. Explanation:      All Pages Ok Pages Sent:       12 Connect Time:     7 minutes, 3 seconds Transmit Time:    09/11/2023 09:36 Transfer Rate:    14400 Status Code:      0000 Retry Count:      0 Job Id:           [id number] Unique Id:        [id number] Fax Line:         49 Fax Server:       [id number]

## 2023-09-11 NOTE — Telephone Encounter (Signed)
I have spoken to Pierce at [employer] to re run the script under the patient new insurance to see what the co pay will be. I had to refax the order from 05/2023 to him. He will re run and let me know if there is any change to the copay, which was 1,254.00 on 06/10/2023. I re submitted the PA to the patient new insurance and it was covered,but just awaiting what the co pay amount will be. Patient was denied any patient assistance with the drug manufacturer. If co pay is still to high for the patient the options will be for him to call his plan and get on the Medicare payment plan or for him to reach out to the Social Security Administration to see if he qualifies for the Low income subsidy. I will await co pay information from [facility] with [employer] and let the patient know his options at that time.

## 2023-09-11 NOTE — Telephone Encounter (Signed)
I called and left a message, asked that the patient return call to the office, so I can make him aware of the status of his medication.

## 2023-09-15 NOTE — Telephone Encounter (Signed)
 Per Krystal Lighter with [employer],  Jerome Camacho May 22, 1966 is approved thru 12/03/2023 with $8326.03 copay. We will reach out to patient to discuss cost. There is no assistance so he can reach out to his medicare plan to set up payment plan as after this months copays it goes to $0 for rest of the year.

## 2023-09-16 NOTE — Telephone Encounter (Signed)
I called and left a message on vm, asked that the patient please return call.

## 2023-09-16 NOTE — Telephone Encounter (Signed)
 Once the patient returns call and if he can not afford the 1,600 medication, I will make him aware of the low income subsidy program through [facility], Or the MP3 program, where he can call Medicare and arrange for monthly payments on the medication .   You can apply for the Low Income Subsidy (LIS) at your local Social Security office. You can also apply online or by phone.  How to apply In person: Visit your local Social Security office and request a paper application or schedule an appointment  By phone: Call Social Security at [phone] (TTY [phone])  Online: Apply online at [email or web address]  What you'll need  You'll need to provide information about your financial situation. This may include: Bank statements and tax returns Individual Retirement Account (IRA) or 401(k) account balances Statements for pensions, Veterans' benefits, annuities, and [employer] benefits Who may qualify  You may qualify if you: Receive Medicare Have limited resources and income Reside in one of the 41 states or the [address] of [place] You can also Chartered Loss Adjuster Aid Office if you are denied eligibility.

## 2023-09-17 ENCOUNTER — Other Ambulatory Visit (INDEPENDENT_AMBULATORY_CARE_PROVIDER_SITE_OTHER): Payer: Self-pay

## 2023-09-17 DIAGNOSIS — B182 Chronic viral hepatitis C: Secondary | ICD-10-CM

## 2023-09-17 MED ORDER — MAVYRET 100-40 MG PO TABS: 3.0000 | ORAL_TABLET | Freq: Every day | ORAL | 1 refills | Status: AC

## 2023-09-17 NOTE — Telephone Encounter (Signed)
 Script sent to [facility] for Mavyret , and faxed patient records to [facility].

## 2023-09-17 NOTE — Telephone Encounter (Signed)
 I spoke with Jerome Camacho and made him aware he did not qualify for patient assistance for Epclusa . I advised since he has medicare, he would need to reach out to his insurance to set up payment options with them or contact the Social Security office and see is he qualifies for a Low Income subsidy through the government.   Patient wanted to know if there were another medication he could try , I advised I would check with you to see if Mavyret  would be appropriate. I did advise this medication may be the same situation, but I would check with you and if agreeable we would send to [facility].   Please advise, Thanks

## 2023-09-17 NOTE — Telephone Encounter (Signed)
 Hi Lets send Mavyret  3 tablets (100/40 mg tablets) daily for 8 weeks.  If approved, patient needs to STOP taking atorvastatin  for the 8 weeks he is taking the medication.  He can restart taking this medicine after finishing the antiviral course.

## 2023-09-18 NOTE — Telephone Encounter (Signed)
 Per Krystal the [place] will be the same out come as the Epculsa, as the Mayvret cost more and the patient would have to meet his out of pocket max for Medicare. I asked that he go ahead and run through insurance. Todd to let me know when this is done and fax over the outcome.

## 2023-09-18 NOTE — Telephone Encounter (Signed)
 This message was sent via FAXCOM, a product from [employer]. [email or web address].biscom.com/                    -------Fax Transmission Report-------  To:               Recipient at [phone] Subject:          J. Moralez Mavyret  Result:           The transmission was successful. Explanation:      All Pages Ok Pages Sent:       41 Connect Time:     24 minutes, 20 seconds Transmit Time:    09/17/2023 16:50 Transfer Rate:    14400 Status Code:      0000 Retry Count:      0 Job Id:           [id number] Unique Id:        [id number] Fax Line:         50 Fax Server:       [employer]

## 2023-09-18 NOTE — Telephone Encounter (Signed)
 Email sent to Rosaleen Collum to check status of script.

## 2023-09-21 NOTE — Telephone Encounter (Signed)
 PA submitted to patients plan.

## 2023-09-22 NOTE — Telephone Encounter (Signed)
Per [name] with [employer], Mayvret is approved thru 11/16/2023 but copay still is [phone]

## 2023-09-22 NOTE — Telephone Encounter (Signed)
I called and left a message asked the patient to please return call to the office.

## 2023-09-25 ENCOUNTER — Encounter (INDEPENDENT_AMBULATORY_CARE_PROVIDER_SITE_OTHER): Payer: Self-pay

## 2023-09-25 NOTE — Telephone Encounter (Signed)
I called and left a message,asked that the patient please return call to the office.

## 2023-09-25 NOTE — Telephone Encounter (Signed)
Mailed a letter to the patient home stating the same thing as the My Chart message said on 09/25/2023.

## 2023-12-02 ENCOUNTER — Ambulatory Visit: Admitting: Internal Medicine

## 2024-01-19 ENCOUNTER — Encounter (INDEPENDENT_AMBULATORY_CARE_PROVIDER_SITE_OTHER): Payer: Self-pay | Admitting: Gastroenterology

## 2024-01-27 ENCOUNTER — Ambulatory Visit: Admitting: Student

## 2024-03-27 ENCOUNTER — Other Ambulatory Visit: Payer: Self-pay | Admitting: Student

## 2024-05-25 ENCOUNTER — Encounter (INDEPENDENT_AMBULATORY_CARE_PROVIDER_SITE_OTHER): Payer: Self-pay | Admitting: Gastroenterology

## 2024-06-26 ENCOUNTER — Other Ambulatory Visit: Payer: Self-pay | Admitting: Cardiology

## 2024-07-07 ENCOUNTER — Other Ambulatory Visit: Payer: Self-pay | Admitting: Cardiology

## 2024-09-02 ENCOUNTER — Other Ambulatory Visit: Payer: Self-pay | Admitting: Cardiology
